# Patient Record
Sex: Male | Born: 1935 | Race: White | Hispanic: No | Marital: Married | Smoking: Never smoker
Health system: Southern US, Community
[De-identification: ages and names within clinical notes are randomized; demographics above are authoritative.]

## PROBLEM LIST (undated history)

## (undated) DIAGNOSIS — G309 Alzheimer's disease, unspecified: Secondary | ICD-10-CM

## (undated) DIAGNOSIS — E039 Hypothyroidism, unspecified: Secondary | ICD-10-CM

## (undated) DIAGNOSIS — F02818 Dementia in other diseases classified elsewhere, unspecified severity, with other behavioral disturbance: Secondary | ICD-10-CM

## (undated) DIAGNOSIS — F0281 Dementia in other diseases classified elsewhere with behavioral disturbance: Secondary | ICD-10-CM

## (undated) DIAGNOSIS — M1990 Unspecified osteoarthritis, unspecified site: Secondary | ICD-10-CM

## (undated) DIAGNOSIS — I10 Essential (primary) hypertension: Secondary | ICD-10-CM

## (undated) HISTORY — PX: RECONSTRUCTION MEDIAL COLLATERAL LIGAMENT ELBOW W/ TENDON GRAFT: SUR1084

## (undated) HISTORY — DX: Essential (primary) hypertension: I10

## (undated) HISTORY — DX: Dementia in other diseases classified elsewhere with behavioral disturbance: F02.81

## (undated) HISTORY — DX: Hypothyroidism, unspecified: E03.9

## (undated) HISTORY — DX: Alzheimer's disease, unspecified: G30.9

## (undated) HISTORY — DX: Dementia in other diseases classified elsewhere, unspecified severity, with other behavioral disturbance: F02.818

## (undated) HISTORY — DX: Unspecified osteoarthritis, unspecified site: M19.90

---

## 2003-03-24 ENCOUNTER — Ambulatory Visit (HOSPITAL_COMMUNITY): Admission: RE | Admit: 2003-03-24 | Discharge: 2003-03-24 | Payer: Self-pay | Admitting: Cardiovascular Disease

## 2003-03-24 ENCOUNTER — Encounter: Payer: Self-pay | Admitting: Cardiovascular Disease

## 2003-06-16 ENCOUNTER — Emergency Department (HOSPITAL_COMMUNITY): Admission: AD | Admit: 2003-06-16 | Discharge: 2003-06-16 | Payer: Self-pay | Admitting: Family Medicine

## 2003-11-03 ENCOUNTER — Ambulatory Visit (HOSPITAL_COMMUNITY): Admission: RE | Admit: 2003-11-03 | Discharge: 2003-11-03 | Payer: Self-pay | Admitting: Cardiovascular Disease

## 2007-09-20 ENCOUNTER — Ambulatory Visit: Payer: Self-pay | Admitting: Vascular Surgery

## 2009-10-01 ENCOUNTER — Ambulatory Visit: Payer: Self-pay | Admitting: Family Medicine

## 2009-10-01 DIAGNOSIS — Z9189 Other specified personal risk factors, not elsewhere classified: Secondary | ICD-10-CM | POA: Insufficient documentation

## 2009-10-01 DIAGNOSIS — E039 Hypothyroidism, unspecified: Secondary | ICD-10-CM | POA: Insufficient documentation

## 2009-10-01 DIAGNOSIS — F068 Other specified mental disorders due to known physiological condition: Secondary | ICD-10-CM

## 2009-10-01 DIAGNOSIS — I10 Essential (primary) hypertension: Secondary | ICD-10-CM | POA: Insufficient documentation

## 2009-10-01 DIAGNOSIS — M129 Arthropathy, unspecified: Secondary | ICD-10-CM | POA: Insufficient documentation

## 2009-10-02 LAB — CONVERTED CEMR LAB
BUN: 13 mg/dL (ref 6–23)
Basophils Absolute: 0 10*3/uL (ref 0.0–0.1)
Basophils Relative: 0.3 % (ref 0.0–3.0)
CO2: 28 meq/L (ref 19–32)
Calcium: 8.9 mg/dL (ref 8.4–10.5)
Chloride: 110 meq/L (ref 96–112)
Creatinine, Ser: 1.1 mg/dL (ref 0.4–1.5)
Eosinophils Absolute: 0.2 10*3/uL (ref 0.0–0.7)
Eosinophils Relative: 2.7 % (ref 0.0–5.0)
GFR calc non Af Amer: 67.74 mL/min (ref >60)
Glucose, Bld: 92 mg/dL (ref 70–99)
HCT: 43.6 % (ref 39.0–52.0)
Hemoglobin: 15.1 g/dL (ref 13.0–17.0)
Lymphocytes Relative: 12.7 % (ref 12.0–46.0)
Lymphs Abs: 0.7 10*3/uL (ref 0.7–4.0)
MCHC: 34.7 g/dL (ref 30.0–36.0)
MCV: 100.7 fL — ABNORMAL HIGH (ref 78.0–100.0)
Monocytes Absolute: 0.4 10*3/uL (ref 0.1–1.0)
Monocytes Relative: 7.5 % (ref 3.0–12.0)
Neutro Abs: 4.4 10*3/uL (ref 1.4–7.7)
Neutrophils Relative %: 76.8 % (ref 43.0–77.0)
Platelets: 160 10*3/uL (ref 150.0–400.0)
Potassium: 4.5 meq/L (ref 3.5–5.1)
RBC: 4.33 M/uL (ref 4.22–5.81)
RDW: 13.4 % (ref 11.5–14.6)
Sodium: 148 meq/L — ABNORMAL HIGH (ref 135–145)
TSH: 3.4 microintl units/mL (ref 0.35–5.50)
WBC: 5.7 10*3/uL (ref 4.5–10.5)

## 2010-04-18 ENCOUNTER — Ambulatory Visit: Payer: Self-pay | Admitting: Family Medicine

## 2010-04-18 LAB — CONVERTED CEMR LAB
BUN: 17 mg/dL (ref 6–23)
Basophils Absolute: 0 10*3/uL (ref 0.0–0.1)
Basophils Relative: 0.4 % (ref 0.0–3.0)
CO2: 27 meq/L (ref 19–32)
Calcium: 8.9 mg/dL (ref 8.4–10.5)
Chloride: 101 meq/L (ref 96–112)
Creatinine, Ser: 1.2 mg/dL (ref 0.4–1.5)
Eosinophils Absolute: 0 10*3/uL (ref 0.0–0.7)
Eosinophils Relative: 0.6 % (ref 0.0–5.0)
GFR calc non Af Amer: 63.01 mL/min (ref >60)
Glucose, Bld: 79 mg/dL (ref 70–99)
HCT: 47.1 % (ref 39.0–52.0)
Hemoglobin: 16.2 g/dL (ref 13.0–17.0)
Lymphocytes Relative: 11 % — ABNORMAL LOW (ref 12.0–46.0)
Lymphs Abs: 0.8 10*3/uL (ref 0.7–4.0)
MCHC: 34.3 g/dL (ref 30.0–36.0)
MCV: 101 fL — ABNORMAL HIGH (ref 78.0–100.0)
Monocytes Absolute: 0.6 10*3/uL (ref 0.1–1.0)
Monocytes Relative: 8.4 % (ref 3.0–12.0)
Neutro Abs: 5.6 10*3/uL (ref 1.4–7.7)
Neutrophils Relative %: 79.6 % — ABNORMAL HIGH (ref 43.0–77.0)
Platelets: 157 10*3/uL (ref 150.0–400.0)
Potassium: 4.1 meq/L (ref 3.5–5.1)
RBC: 4.66 M/uL (ref 4.22–5.81)
RDW: 13.4 % (ref 11.5–14.6)
Sodium: 136 meq/L (ref 135–145)
TSH: 1.3 microintl units/mL (ref 0.35–5.50)
WBC: 7 10*3/uL (ref 4.5–10.5)

## 2010-05-21 ENCOUNTER — Encounter: Payer: Self-pay | Admitting: Family Medicine

## 2010-06-25 ENCOUNTER — Telehealth (INDEPENDENT_AMBULATORY_CARE_PROVIDER_SITE_OTHER): Payer: Self-pay | Admitting: *Deleted

## 2010-07-23 NOTE — Assessment & Plan Note (Signed)
Summary: NEW PATIENT /[facility]   Vital Signs:  Patient profile:   77 year old male Height:      67.5 inches Weight:      163.8 pounds Temp:     97.5 degrees F oral Pulse rate:   72 / minute Pulse rhythm:   regular BP sitting:   112 / 80  (left arm) Cuff size:   regular  Vitals Entered By: Benny Lennert CMA Duncan Dull) (October 01, 2009 9:04 AM)  History of Present Illness: Chief complaint new patient to be established   HTN: stable on meds  Thyroid: stable on current meds  Memory - has been an issue for about eight years. Getting worse. Namenda one by mouth two times a day  Exelon patch Was also on Aricept at the onset - went sort of crazy  Fractures as a child  ? Diminished blood supply to feel Feet are cold now about all the time  No office viist, > 6 mo  Left knee: small effusion, some pain and limping. No traumatic injury. No symptomatic giving way. No mechanical locking up.     Preventive Screening-Counseling & Management  Alcohol-Tobacco     Smoking Status: never  Caffeine-Diet-Exercise     Does Patient Exercise: no      Drug Use:  no.    Allergies (verified): No Known Drug Allergies  Past History:  Past medical, surgical, family and social histories (including risk factors) reviewed, and no changes noted (except as noted below).  Past Medical History: ESSENTIAL HYPERTENSION (ICD-401.9) CHICKENPOX, HX OF (ICD-V15.9) ARTHRITIS (ICD-716.90) Hypothyroidism Dementia  Past Surgical History: elbow reconstruction from fall 1977  Family History: Reviewed history and no changes required. Family History of Alcoholism/Addiction Family History Breast cancer 1st degree relative <50 Family History of Stroke F 1st degree relative <60 Family History of Stroke M 1st degree relative <50 Family History of Sudden Death  Social History: Reviewed history and no changes required. Retired Married Never Smoked Alcohol use-no Drug use-no Regular exercise-no Smoking  Status:  never Drug Use:  no Does Patient Exercise:  no  Review of Systems       ROS: GEN: No acute illnesses, no fevers, chills, sweats, fatigue, weight loss, or URI sx. GI: No n/v/d Pulm: No SOB, cough, wheezing Interactive and getting along well at home, but with memory issues. Otherwise, the pertinent positives and negatives are listed above and in the HPI, otherwise a full review of systems has been reviewed and is negative unless noted positive.   Physical Exam  General:  Well-developed,well-nourished,in no acute distress; alert,appropriate and cooperative throughout examination Head:  Normocephalic and atraumatic without obvious abnormalities. No apparent alopecia or balding. Ears:  no external deformities.   Nose:  no external deformity.   Mouth:  pharynx pink and moist.   Neck:  No deformities, masses, or tenderness noted. Lungs:  Normal respiratory effort, chest expands symmetrically. Lungs are clear to auscultation, no crackles or wheezes. Heart:  Normal rate and regular rhythm. S1 and S2 normal without gallop, murmur, click, rub or other extra sounds. Abdomen:  Bowel sounds positive,abdomen soft and non-tender without masses, organomegaly or hernias noted. Msk:  B knees Gait: Normal heel toe pattern ROM: WNL Effusion: mild L Echymosis or edema: none Patellar tendon NT Painful PLICA: neg Patellar grind: negative Medial and lateral patellar facet loading: negative medial and lateral joint lines: mod tender medial joint line Mcmurray's neg Flexion-pinch neg Varus and valgus stress: stable Lachman: neg Ant and Post drawer: neg  Hip abduction, IR, ER: WNL Hip flexion str: 5/5 Hip abd: 5/5 Quad: 5/5 VMO atrophy: mild Hamstring concentric and eccentric: 5/5  Neurologic:  gait normal.   Psych:  normally interactive, good eye contact, not anxious appearing, and not depressed appearing.     Impression & Recommendations:  Problem # 1:  ARTHRITIS  (ICD-716.90) Assessment New Please see the patient instructions for a detailed list of plans and what was discussed with the patient.   L knee OA flare Start with conservative management first  Orders: Prescription Created Electronically [phone])  Problem # 2:  DEMENTIA (ICD-294.8) Assessment: New worsening, cont with Namenda, at max dosing  failed exelon and aricept  Problem # 3:  HYPOTHYROIDISM (ICD-244.9) Assessment: New  His updated medication list for this problem includes:    Synthroid 88 Mcg Tabs (Levothyroxine sodium) ..... Once daily  Orders: TLB-TSH (Thyroid Stimulating Hormone) (84443-TSH)  Problem # 4:  ESSENTIAL HYPERTENSION (ICD-401.9) Assessment: New  His updated medication list for this problem includes:    Cozaar 100 Mg Tabs (Losartan potassium) ..... Once daily  Orders: TLB-BMP (Basic Metabolic Panel-BMET) (80048-METABOL)  BP today: 112/80  Problem # 5:  ENCOUNTER FOR LONG-TERM USE OF OTHER MEDICATIONS (ICD-V58.69)  Orders: Venipuncture (60454) TLB-CBC Platelet - w/Differential (85025-CBCD)  Complete Medication List: 1)  Flomax 0.4 Mg Caps (Tamsulosin hcl) .... Once daily 2)  Cozaar 100 Mg Tabs (Losartan potassium) .... Once daily 3)  Synthroid 88 Mcg Tabs (Levothyroxine sodium) .... Once daily 4)  Namenda 10 Mg Tabs (Memantine hcl) .... 2 daily 5)  Aspir-low 81 Mg Tbec (Aspirin) .... Once daily 6)  Vitamin D  7)  Vitamin B-12 1000 Mcg Tabs (Cyanocobalamin) .... Daily 8)  Meloxicam 7.5 Mg Tabs (Meloxicam) .Marland Kitchen.. 1 by mouth daily  Patient Instructions: 1)  Tylenol: 2 tablets up to 3-4 times a day 2)  Regular NSAIDS (Meloxicam) - as needed 3)  Topical Capzaicin Cream, as needed (wear glove to put on) 4)  Topical Voltaren (NSAID) Gel can help  5)  For flares, corticosteroid injections help. 6)  Hyaluronic Acid injections have good success, average relief is 6 months 7)  Glucosamine and Chondroitin often helpful 8)  Omega-3 fish oils may help 9)   Ice joints on bad days, 20 min, 2-3 x / day 10)  REGULAR EXERCISE: swimming, Yoga, Tai Chi, bicycle (NON-IMPACT activity  11)  f/u 6 months Prescriptions: MELOXICAM 7.5 MG TABS (MELOXICAM) 1 by mouth daily  #30 x 5   Entered and Authorized by:   Hannah Beat MD   Signed by:   Hannah Beat MD on 10/01/2009   Method used:   Electronically to        [facility]* (retail)       [address]       [place], [place]  [address]       Ph: [phone]       Fax: [phone]   RxID:   [id number]   Current Allergies (reviewed today): No known allergies

## 2010-07-23 NOTE — Letter (Signed)
Summary: [facility]  [facility]   Imported By: Lester [place] 05/25/2010 10:46:47  _____________________________________________________________________  External Attachment:    Type:   Image     Comment:   External Document

## 2010-07-23 NOTE — Assessment & Plan Note (Signed)
Summary: 6 MTH FU/CLE   Vital Signs:  Patient profile:   77 year old male Height:      67.5 inches Weight:      158 pounds BMI:     24.47 Temp:     97.4 degrees F oral Pulse rate:   76 / minute Pulse rhythm:   regular BP sitting:   122 / 76  (left arm) Cuff size:   regular  Vitals Entered By: Lewanda Rife LPN (April 18, 2010 11:52 AM) CC: six month f/u   History of Present Illness: 77 year old male:  Dementia: Short term memory is very poor. Gets lost some with readigng. Has gotten lost several times in the car, once ended up in [place].   Son just moved up to help out.  Sleeping well.  Family just made the decision he needs not to drive - he is OK with that.  HTN: stable and doing well  Asymptomatic from a thyroid standpoint, some ? dep per wife.   Allergies (verified): No Known Drug Allergies  Past History:  Past medical, surgical, family and social histories (including risk factors) reviewed, and no changes noted (except as noted below).  Past Medical History: Reviewed history from 10/01/2009 and no changes required. ESSENTIAL HYPERTENSION (ICD-401.9) CHICKENPOX, HX OF (ICD-V15.9) ARTHRITIS (ICD-716.90) Hypothyroidism Dementia  Past Surgical History: Reviewed history from 10/01/2009 and no changes required. elbow reconstruction from fall 1977  Family History: Reviewed history from 10/01/2009 and no changes required. Family History of Alcoholism/Addiction Family History Breast cancer 1st degree relative <50 Family History of Stroke F 1st degree relative <60 Family History of Stroke M 1st degree relative <50 Family History of Sudden Death  Social History: Reviewed history from 10/01/2009 and no changes required. Retired Married Never Smoked Alcohol use-no Drug use-no Regular exercise-no  Review of Systems General:  Denies chills and fever. CV:  Denies chest pain or discomfort. Neuro:  Complains of difficulty with concentration, headaches, and  memory loss; denies brief paralysis, disturbances in coordination, inability to speak, numbness, poor balance, and visual disturbances; 1 ha for a few hours a few weeks ago.  Physical Exam  Additional Exam:  GEN: WDWN, NAD, Non-toxic, A & O x 3 HEENT: Atraumatic, Normocephalic. Neck supple. No masses, No LAD. Ears and Nose: No external deformity. CV: RRR, No M/G/R. No JVD. No thrill. No extra heart sounds. PULM: CTA B, no wheezes, crackles, rhonchi. No retractions. No resp. distress. No accessory muscle use. EXTR: No c/c/e NEURO: Normal gait.  PSYCH: Normally interactive. Conversant. Not depressed or anxious appearing.  Calm demeanor.     Impression & Recommendations:  Problem # 1:  DEMENTIA (ICD-294.8) progressing without any abrupt changes. poor short term memory. could not tolerate aricept ? if namenda helping  good family support son just moved in to help  Problem # 2:  HYPOTHYROIDISM (ICD-244.9)  His updated medication list for this problem includes:    Synthroid 88 Mcg Tabs (Levothyroxine sodium) ..... Once daily  Orders: TLB-TSH (Thyroid Stimulating Hormone) (84443-TSH)  Problem # 3:  ESSENTIAL HYPERTENSION (ICD-401.9)  His updated medication list for this problem includes:    Cozaar 100 Mg Tabs (Losartan potassium) ..... Once daily  Complete Medication List: 1)  Flomax 0.4 Mg Caps (Tamsulosin hcl) .... Once daily 2)  Cozaar 100 Mg Tabs (Losartan potassium) .... Once daily 3)  Synthroid 88 Mcg Tabs (Levothyroxine sodium) .... Once daily 4)  Namenda 10 Mg Tabs (Memantine hcl) .... 2 daily 5)  Aspir-low 81  Mg Tbec (Aspirin) .... Once daily 6)  Vitamin B-12 1000 Mcg Tabs (Cyanocobalamin) .... Daily 7)  Meloxicam 7.5 Mg Tabs (Meloxicam) .Marland Kitchen.. 1 by mouth daily as needed 8)  Vitamin D 400 Unit Tabs (Cholecalciferol) .... Take 1 tablet by mouth once a day  Other Orders: Flu Vaccine 60yrs + MEDICARE PATIENTS (Z6109) Administration Flu vaccine - MCR (G0008) Venipuncture  (60454) TLB-BMP (Basic Metabolic Panel-BMET) (80048-METABOL) TLB-CBC Platelet - w/Differential (85025-CBCD)  Patient Instructions: 1)  f/u 6 months   Orders Added: 1)  Flu Vaccine 38yrs + MEDICARE PATIENTS [Q2039] 2)  Administration Flu vaccine - MCR [G0008] 3)  Venipuncture [36415] 4)  TLB-BMP (Basic Metabolic Panel-BMET) [80048-METABOL] 5)  TLB-CBC Platelet - w/Differential [85025-CBCD] 6)  TLB-TSH (Thyroid Stimulating Hormone) [84443-TSH] 7)  Est. Patient Level IV [09811]    Current Allergies (reviewed today): No known allergies   Flu Vaccine Consent Questions     Do you have a history of severe allergic reactions to this vaccine? no    Any prior history of allergic reactions to egg and/or gelatin? no    Do you have a sensitivity to the preservative Thimersol? no    Do you have a past history of Guillan-Barre Syndrome? no    Do you currently have an acute febrile illness? no    Have you ever had a severe reaction to latex? no    Vaccine information given and explained to patient? yes    Are you currently pregnant? no    Lot Number:[id number]   Exp Date:12/21/2010   Site Given  Left Deltoid IMmedflu1 Lewanda Rife LPN  April 18, 2010 11:54 AM

## 2010-07-25 NOTE — Progress Notes (Signed)
Summary: wants to change to aricept  Phone Note Call from Patient Call back at Home Phone [phone]   Caller: Spouse Call For: Hannah Beat MD Summary of Call: Pt's wife states nemenda is expensive and she is asking if pt can try generic aricept.  Uses [facility].  She knows you are out of the office today.  Initial call taken by: Lowella Petties CMA, AAMA,  June 25, 2010 11:09 AM  Follow-up for Phone Call        can they follow-up with me a few weeks after starting.  often causes some sleep disturbance, vivid dreams - particularly at the beginning, and nausea. take after dinner Follow-up by: Hannah Beat MD,  June 25, 2010 11:50 AM  Additional Follow-up for Phone Call Additional follow up Details #1::        patietn wife advised and she says he has a follow up appt pretty soon.Consuello Masse CMA   Additional Follow-up by: Benny Lennert CMA (AAMA),  June 25, 2010 12:01 PM    New/Updated Medications: DONEPEZIL HCL 5 MG TABS (DONEPEZIL HCL) 1 by mouth at bedtime after dinner Prescriptions: DONEPEZIL HCL 5 MG TABS (DONEPEZIL HCL) 1 by mouth at bedtime after dinner  #30 x 3   Entered and Authorized by:   Hannah Beat MD   Signed by:   Hannah Beat MD on 06/25/2010   Method used:   Electronically to        [facility]* (retail)       [address]       [place], [place]  [address]       Ph: [phone]       Fax: [phone]   RxID:   [id number]

## 2010-08-21 ENCOUNTER — Encounter: Payer: Self-pay | Admitting: Family Medicine

## 2010-10-10 ENCOUNTER — Ambulatory Visit (INDEPENDENT_AMBULATORY_CARE_PROVIDER_SITE_OTHER): Payer: Medicare Other | Admitting: Family Medicine

## 2010-10-10 ENCOUNTER — Encounter: Payer: Self-pay | Admitting: Family Medicine

## 2010-10-10 DIAGNOSIS — F068 Other specified mental disorders due to known physiological condition: Secondary | ICD-10-CM

## 2010-10-10 DIAGNOSIS — E039 Hypothyroidism, unspecified: Secondary | ICD-10-CM

## 2010-10-10 DIAGNOSIS — I10 Essential (primary) hypertension: Secondary | ICD-10-CM

## 2010-10-10 MED ORDER — DONEPEZIL HCL 10 MG PO TABS: 10.0000 mg | ORAL_TABLET | Freq: Every day | ORAL | Status: DC

## 2010-10-10 NOTE — Patient Instructions (Signed)
F/u 6 months

## 2010-10-10 NOTE — Progress Notes (Signed)
77 year old:  F/u Thyroid:  Dementia:  HTN:  [facility] at [facility] Attn: Dr. Zollie Pee   Subjective:     Dylan Roach is a 77 y.o. male who presents for follow up of hypothyroidism, dementia, HTN. Current symptoms: none. Patient denies heat / cold intolerance and nervousness. Symptoms have been well-controlled.  Dementia, worsening relatively dramatically. Outbursts of aggression towards wife, violent behavior per report. Now sleeping in different bedrooms with lock on bedroom. Son moved into their house which has helped with stability and safety. Will often forget family member's names.   Daughter lives across the street.  Namenda - d/c secondary to cost, and has tolerated Aricept 5 mg, which previously could not tolerate.  HTN: Tolerating all medications without side effects Stable and at goal No CP, no sob. No HA.  BP Readings from Last 3 Encounters:  10/10/10 130/80  04/18/10 122/76  10/01/09 112/80    Patient Active Problem List  Diagnoses  . HYPOTHYROIDISM  . DEMENTIA  . ESSENTIAL HYPERTENSION  . ARTHRITIS  . CHICKENPOX, HX OF   Past Medical History  Diagnosis Date  . Arthritis   . Thyroid disease   . Dementia   . History of chicken pox   . Hypertension    Past Surgical History  Procedure Date  . Reconstruction medial collateral ligament elbow w/ tendon graft    History  Substance Use Topics  . Smoking status: Never Smoker   . Smokeless tobacco: Not on file  . Alcohol Use: No   No family history on file. No Known Allergies Current Outpatient Prescriptions on File Prior to Visit  Medication Sig Dispense Refill  . aspirin 81 MG EC tablet Take 81 mg by mouth daily.        Marland Kitchen levothyroxine (SYNTHROID, LEVOTHROID) 88 MCG tablet Take 88 mcg by mouth daily.        Marland Kitchen losartan (COZAAR) 100 MG tablet Take 100 mg by mouth daily.        . meloxicam (MOBIC) 7.5 MG tablet Take 7.5 mg by mouth daily.        . Tamsulosin HCl (FLOMAX)  0.4 MG CAPS Take 0.4 mg by mouth daily.        . vitamin B-12 (CYANOCOBALAMIN) 1000 MCG tablet Take 1,000 mcg by mouth daily.        . vitamin D, CHOLECALCIFEROL, 400 UNITS tablet Take 400 Units by mouth daily.        Marland Kitchen DISCONTD: donepezil (ARICEPT) 5 MG tablet Take 5 mg by mouth at bedtime.         (Increased to Aricept 10 mg)   Review of Systems ROS: GEN: No acute illnesses, no fevers, chills. GI: No n/v/d, eating normally Pulm: No SOB Memory loss and combativeness is getting more severe Otherwise, ROS is as per the HPI.    Objective:   GEN: WDWN, NAD, Non-toxic HEENT: Atraumatic, Normocephalic. Neck supple. No masses, No LAD. Ears and Nose: No external deformity. CV: RRR, No M/G/R. No JVD. No thrill. No extra heart sounds. PULM: CTA B, no wheezes, crackles, rhonchi. No retractions. No resp. distress. No accessory muscle use. ABD: S, NT, ND, +BS. No rebound tenderness. No HSM.  EXTR: No c/c/e NEURO Normal gait.  PSYCH: Normally interactive. Conversant. Not depressed or anxious appearing.  Calm demeanor.   Laboratory: Lab Results  Component Value Date   TSH 1.30 04/18/2010      Assessment:    Hypothyroidism.  Replacement per meds.   Dementia, unstable, worsening HTN, stable, doing well   Plan:    1. L-thyroxine per orders. 2. Recheck thyroid function tests today 3. Repeat other baseline labs as below 4. Worsening dementia, more combative, aggressive. They are going to consult [facility], which seems very reasonable. Welcome consult, advice or suggestions regarding long-term management.

## 2010-10-11 LAB — BASIC METABOLIC PANEL
CO2: 31 mEq/L (ref 19–32)
Chloride: 105 mEq/L (ref 96–112)
Glucose, Bld: 75 mg/dL (ref 70–99)
Potassium: 4.2 mEq/L (ref 3.5–5.1)
Sodium: 145 mEq/L (ref 135–145)

## 2010-10-11 LAB — VITAMIN D 25 HYDROXY (VIT D DEFICIENCY, FRACTURES): Vit D, 25-Hydroxy: 43 ng/mL (ref 30–89)

## 2010-10-11 LAB — TSH: TSH: 1.56 u[IU]/mL (ref 0.35–5.50)

## 2010-11-06 ENCOUNTER — Telehealth: Payer: Self-pay | Admitting: *Deleted

## 2010-11-06 NOTE — Telephone Encounter (Signed)
Wife called asking if records had been faxed to [facility] because his appt is tomorrow.   Per Herbert Seta they were faxed, wife notified.

## 2010-11-08 NOTE — Cardiovascular Report (Signed)
NAME:  Dylan Roach, Dylan Roach                        ACCOUNT NO.:  [id number]   MEDICAL RECORD NO.:  [id number]                   PATIENT TYPE:  OIB   LOCATION:  2899                                 FACILITY:  [facility]   PHYSICIAN:  Ricki Rodriguez, M.D.               DATE OF BIRTH:  [birth year]   DATE OF PROCEDURE:  11/03/2003  DATE OF DISCHARGE:  11/03/2003                              CARDIAC CATHETERIZATION   PROCEDURES PERFORMED:  1. Left heart catheterization.  2. Selective coronary angiography.  3. Left ventricular systolic function study.   CARDIOLOGIST:  Ricki Rodriguez, M.D.   INDICATIONS:  This 77 year old white male had an abnormal stress test along  with atypical chest pain and shortness of breath, plus history of  hypertension.   ACCESS:  Approach; right femoral artery using 5 French sheath.   HEMODYNAMIC DATA:  The left ventricular pressure was 123/16 and aortic  pressure was 123/59.   ANGIOGRAPHIC DATA:  Coronary Anatomy  Left Main Coronary Artery:  The left main coronary artery showed proximal  calcification, otherwise was unremarkable.   Left Anterior Descending Coronary Artery.  The left anterior descending  coronary artery also showed proximal calcification and luminal  irregularities proximally, and a distal diffuse narrowing.  The diagonal-1  was unremarkable.   Left Circumflex Coronary Artery:  The left circumflex coronary artery was a  relatively smaller vessel and its ramus branch was a larger vessel with  proximal luminal irregularities.  The obtuse marginal branches were  rudimentary.   Right Coronary Artery:  The right coronary artery is dominant with a  proximal 20% stenosis followed by 40% stenosis, then midvessel luminal  irregularities; and, also showed mild calcification in the proximal half of  the vessel.  Its posterior descending coronary artery and posterolateral  branches were unremarkable.   VENTRICULOGRAPHIC DATA:  Left Ventriculogram:  The  left ventriculogram  showed mild anterior and inferior hypokinesia with an ejection fraction of  50%.   IMPRESSION:  1. Mild-to-moderate multivessel native vessels coronary artery disease.  2. Mild left ventricular systolic dysfunction.   RECOMMENDATIONS:  This patient will be treated medically by Dr. Orpah Cobb.                                               Ricki Rodriguez, M.D.    ASK/MEDQ  D:  11/03/2003  T:  11/03/2003  Job:  [id number]

## 2010-11-27 ENCOUNTER — Encounter: Payer: Self-pay | Admitting: Family Medicine

## 2010-11-27 ENCOUNTER — Encounter: Payer: Self-pay | Admitting: *Deleted

## 2010-11-28 ENCOUNTER — Encounter: Payer: Self-pay | Admitting: Family Medicine

## 2010-11-28 ENCOUNTER — Ambulatory Visit (INDEPENDENT_AMBULATORY_CARE_PROVIDER_SITE_OTHER): Payer: Medicare Other | Admitting: Family Medicine

## 2010-11-28 DIAGNOSIS — R5381 Other malaise: Secondary | ICD-10-CM

## 2010-11-28 DIAGNOSIS — F0280 Dementia in other diseases classified elsewhere without behavioral disturbance: Secondary | ICD-10-CM

## 2010-11-28 DIAGNOSIS — R110 Nausea: Secondary | ICD-10-CM

## 2010-11-28 DIAGNOSIS — F068 Other specified mental disorders due to known physiological condition: Secondary | ICD-10-CM

## 2010-11-28 DIAGNOSIS — R5383 Other fatigue: Secondary | ICD-10-CM

## 2010-11-28 DIAGNOSIS — R413 Other amnesia: Secondary | ICD-10-CM

## 2010-11-28 DIAGNOSIS — G309 Alzheimer's disease, unspecified: Secondary | ICD-10-CM

## 2010-11-28 MED ORDER — DONEPEZIL HCL 5 MG PO TABS: 5.0000 mg | ORAL_TABLET | Freq: Every evening | ORAL | Status: DC | PRN

## 2010-11-28 NOTE — Patient Instructions (Addendum)
Decrease the ARICEPT TO 5 MG CUT THE CELEXA IN HALF TO 10 MG  PUD and Dyspepsia  STOP THE MELOXICAM RIGHT NOW -   2. Stop aspirin and NSAID use 3. If mild, OTC Zantac or Pepcid can help, treat 6-12 weeks 4. If severe, prescription likely needed, 3-6 months 5. Smoking, alcohol, caffeine can make worse 6. Don't eat foods that make you worse   REFERRAL: GO THE THE FRONT ROOM AT THE ENTRANCE OF OUR CLINIC, NEAR CHECK IN. ASK FOR MARION. SHE WILL HELP YOU SET UP YOUR REFERRAL. DATE: TIME:

## 2010-11-28 NOTE — Progress Notes (Signed)
Dylan Roach, a 77 y.o. male presents today in the office for the following:    Stomach:  Worsening dementia over the last few years. Recent evaluation at the [facility] memory clinic. Recommendations included Celexa and MRI for evaluation for potential component of Vascular dementia.   Lab Results  Component Value Date   TSH 1.56 10/10/2010   Lab Results  Component Value Date   VITAMINB12 733 10/10/2010   Printed a copy of labs for the patient's wife. Feels tired. Diarrhea. Weak, loss of apetite.  Nauseous, not eating well, some diarrhea.  All started a couple of months ago.  Fatigue:  A few weeks ago. Started to stay in bed a lot of the time.  Sleeping all day. Has gotten better in the last few days.   Pain in the lateral side, now gone. There is some burping more frequently now.  ROS above  Patient Active Problem List  Diagnoses  . HYPOTHYROIDISM  . ESSENTIAL HYPERTENSION  . ARTHRITIS  . CHICKENPOX, HX OF  . Memory loss  . Alzheimer's dementia   Past Medical History  Diagnosis Date  . Arthritis   . Alzheimer's dementia with behavioral disturbance   . Hypertension   . Hypothyroid    Past Surgical History  Procedure Date  . Reconstruction medial collateral ligament elbow w/ tendon graft    History  Substance Use Topics  . Smoking status: Never Smoker   . Smokeless tobacco: Not on file  . Alcohol Use: No   No family history on file. No Known Allergies Current Outpatient Prescriptions on File Prior to Visit  Medication Sig Dispense Refill  . aspirin 81 MG EC tablet Take 81 mg by mouth daily.        Marland Kitchen levothyroxine (SYNTHROID, LEVOTHROID) 88 MCG tablet Take 88 mcg by mouth daily.        Marland Kitchen losartan (COZAAR) 100 MG tablet Take 100 mg by mouth daily.        . Tamsulosin HCl (FLOMAX) 0.4 MG CAPS Take 0.4 mg by mouth daily.        . vitamin B-12 (CYANOCOBALAMIN) 1000 MCG tablet Take 1,000 mcg by mouth daily.        . vitamin D, CHOLECALCIFEROL, 400 UNITS tablet Take  400 Units by mouth daily.        . meloxicam (MOBIC) 7.5 MG tablet Take 7.5 mg by mouth daily.          Physical Exam  Blood pressure 94/60, pulse 76, temperature 98 F (36.7 C), temperature source Oral, height 5' 7.5" (1.715 m), weight 144 lb 4 oz (65.431 kg).  GEN: WDWN, NAD, Non-toxic, A & O x 3 HEENT: Atraumatic, Normocephalic. Neck supple. No masses, No LAD. Ears and Nose: No external deformity. CV: RRR, No M/G/R. No JVD. No thrill. No extra heart sounds. PULM: CTA B, no wheezes, crackles, rhonchi. No retractions. No resp. distress. No accessory muscle use. ABD: S, NT, ND, +BS. No rebound tenderness. No HSM.  EXTR: No c/c/e NEURO Normal gait.  PSYCH: Normally interactive. Conversant. Not depressed or anxious appearing.  Calm demeanor.    A/P:  1. Alzheimer's: Copy of labs reviewed with patient and wife - given copy for [facility]. Obtain an MRI of the brain without contrast to evaluate for potential acute vs. Subacute stroke in the setting of memory loss and behavioral change.  2. Nausea: Timing suggests goes along with Aricept alteration of dose. Classic SE of nausea and diarrhea. Will decrease to 5  mg. If not improving, would d/c trial.  3. GERD: some reflux now  4. Fatigue: increased, may be from higher dose celexa in the elderly. Will change to 10 mg. This was done primarily to decrease sexual impulsiveness and aggression towards wife, and has had a great effect, so if this returns, then we will restart.

## 2010-12-02 ENCOUNTER — Encounter: Payer: Self-pay | Admitting: Family Medicine

## 2010-12-02 DIAGNOSIS — F0280 Dementia in other diseases classified elsewhere without behavioral disturbance: Secondary | ICD-10-CM | POA: Insufficient documentation

## 2010-12-02 DIAGNOSIS — G309 Alzheimer's disease, unspecified: Secondary | ICD-10-CM | POA: Insufficient documentation

## 2010-12-05 ENCOUNTER — Ambulatory Visit
Admission: RE | Admit: 2010-12-05 | Discharge: 2010-12-05 | Disposition: A | Discharge status: Home / Self Care | Payer: Medicare Other | Source: Ambulatory Visit | Attending: Family Medicine | Admitting: Family Medicine

## 2010-12-05 DIAGNOSIS — R413 Other amnesia: Secondary | ICD-10-CM

## 2010-12-05 DIAGNOSIS — F068 Other specified mental disorders due to known physiological condition: Secondary | ICD-10-CM

## 2010-12-17 ENCOUNTER — Other Ambulatory Visit: Payer: Self-pay | Admitting: *Deleted

## 2010-12-17 MED ORDER — LEVOTHYROXINE SODIUM 88 MCG PO TABS: 88.0000 ug | ORAL_TABLET | Freq: Every day | ORAL | Status: DC

## 2010-12-17 MED ORDER — TAMSULOSIN HCL 0.4 MG PO CAPS: 0.4000 mg | ORAL_CAPSULE | Freq: Every day | ORAL | Status: DC

## 2011-01-01 ENCOUNTER — Ambulatory Visit (INDEPENDENT_AMBULATORY_CARE_PROVIDER_SITE_OTHER): Payer: Medicare Other | Admitting: Family Medicine

## 2011-01-01 ENCOUNTER — Encounter: Payer: Self-pay | Admitting: Family Medicine

## 2011-01-01 DIAGNOSIS — D729 Disorder of white blood cells, unspecified: Secondary | ICD-10-CM

## 2011-01-01 DIAGNOSIS — R634 Abnormal weight loss: Secondary | ICD-10-CM | POA: Insufficient documentation

## 2011-01-01 DIAGNOSIS — D7289 Other specified disorders of white blood cells: Secondary | ICD-10-CM

## 2011-01-01 DIAGNOSIS — R5381 Other malaise: Secondary | ICD-10-CM

## 2011-01-01 DIAGNOSIS — R5383 Other fatigue: Secondary | ICD-10-CM | POA: Insufficient documentation

## 2011-01-01 DIAGNOSIS — R109 Unspecified abdominal pain: Secondary | ICD-10-CM

## 2011-01-01 LAB — CBC WITH DIFFERENTIAL/PLATELET
Basophils Relative: 0.2 % (ref 0.0–3.0)
Eosinophils Relative: 0.1 % (ref 0.0–5.0)
HCT: 41.9 % (ref 39.0–52.0)
Lymphs Abs: 0.7 10*3/uL (ref 0.7–4.0)
MCV: 100.6 fl — ABNORMAL HIGH (ref 78.0–100.0)
Monocytes Absolute: 0.4 10*3/uL (ref 0.1–1.0)
Monocytes Relative: 5.4 % (ref 3.0–12.0)
RBC: 4.16 Mil/uL — ABNORMAL LOW (ref 4.22–5.81)
WBC: 6.4 10*3/uL (ref 4.5–10.5)

## 2011-01-01 NOTE — Progress Notes (Addendum)
Dylan Roach, a 77 y.o. male presents today in the office for the following:   Having some aches and pains and pain some in the back and on the right side, but will go away before the day is over.   Fatigued and energy level is low. Had some troule standing and felt too weak to shave. Laid down for an hour or so, and then he felt ok.   Weight loss, down eight pounds since 10/2010. "Cannot eat" Down 23 pounds since early 2011  No CP, SOB, bloody stool, bloody urine.  C/o flank pain, worse with motion.   No overt abd pain.  Nausea improved with decreased medication dosing.  Patient Active Problem List  Diagnoses  . HYPOTHYROIDISM  . ESSENTIAL HYPERTENSION  . ARTHRITIS  . CHICKENPOX, HX OF  . Memory loss  . Alzheimer's dementia  . Abdominal  pain, other specified site  . Weight loss, non-intentional   Past Medical History  Diagnosis Date  . Arthritis   . Alzheimer's dementia with behavioral disturbance   . Hypertension   . Hypothyroid    Past Surgical History  Procedure Date  . Reconstruction medial collateral ligament elbow w/ tendon graft    History  Substance Use Topics  . Smoking status: Never Smoker   . Smokeless tobacco: Not on file  . Alcohol Use: No   No family history on file. No Known Allergies Current Outpatient Prescriptions on File Prior to Visit  Medication Sig Dispense Refill  . aspirin 81 MG EC tablet Take 81 mg by mouth daily.        . citalopram (CELEXA) 20 MG tablet Take 20 mg by mouth daily.        Marland Kitchen donepezil (ARICEPT) 5 MG tablet Take 1 tablet (5 mg total) by mouth at bedtime as needed.  30 tablet  5  . levothyroxine (SYNTHROID, LEVOTHROID) 88 MCG tablet Take 1 tablet (88 mcg total) by mouth daily.  30 tablet  11  . losartan (COZAAR) 100 MG tablet Take 100 mg by mouth daily.        . Tamsulosin HCl (FLOMAX) 0.4 MG CAPS Take 1 capsule (0.4 mg total) by mouth daily.  30 capsule  11  . vitamin B-12 (CYANOCOBALAMIN) 1000 MCG tablet Take 1,000  mcg by mouth daily.        . vitamin D, CHOLECALCIFEROL, 400 UNITS tablet Take 400 Units by mouth daily.        . meloxicam (MOBIC) 7.5 MG tablet Take 7.5 mg by mouth daily.          Ros above   Physical Exam  Blood pressure 120/76, pulse 70, temperature 97.6 F (36.4 C), temperature source Oral, height 5' 7.5" (1.715 m), weight 140 lb 1.9 oz (63.558 kg), SpO2 98.00%.  GEN: WDWN, NAD, Non-toxic, A & O x 3 HEENT: Atraumatic, Normocephalic. Neck supple. No masses, No LAD. Ears and Nose: No external deformity. CV: RRR, No M/G/R. No JVD. No thrill. No extra heart sounds. PULM: CTA B, no wheezes, crackles, rhonchi. No retractions. No resp. distress. No accessory muscle use. MSK: TTP at R oblique insertion on R ribs.  ABD: s, nt, nd, +BS, no rebound. EXTR: No c/c/e NEURO Normal gait.  PSYCH: Normally interactive. Conversant. Not depressed or anxious appearing.  Calm demeanor.   Assessment and Plan: 1.  abd pain, flank: insertional, provocable with motion. Reassured. Check ESR 2. Weakness / decreased appetitie. Add Ensure or Boost. Check prealbumin. Check CBC.  Addendum: At the time of addendum:  CBC:    Component Value Date/Time   WBC 6.4 01/01/2011 1437   HGB 14.4 01/01/2011 1437   HCT 41.9 01/01/2011 1437   PLT 184.0 01/01/2011 1437   MCV 100.6* 01/01/2011 1437   NEUTROABS 5.4 01/01/2011 1437   LYMPHSABS 0.7 01/01/2011 1437   MONOABS 0.4 01/01/2011 1437   EOSABS 0.0 01/01/2011 1437   BASOSABS 0.0 01/01/2011 1437   Neutrophil % = 84 % and trending up from 8 months ago 40 + pound weight loss  Discussed all in detail with wife, who would like to proceed with further evaluation. Obtain peripheral smear, CMP. CT of the abdomen and pelvis with and without IV contrast to evaluate for potential mass lesion given all of above and recent abdominal and side pain.  Obtain all of this, then proceed with further decision-making processes.

## 2011-01-02 LAB — PREALBUMIN: Prealbumin: 17 mg/dL (ref 17.0–34.0)

## 2011-01-08 ENCOUNTER — Telehealth: Payer: Self-pay | Admitting: *Deleted

## 2011-01-08 NOTE — Progress Notes (Signed)
Addended by: Hannah Beat on: 01/08/2011 02:07 PM   Modules accepted: Orders

## 2011-01-08 NOTE — Telephone Encounter (Signed)
Noted. Please call wife  Additional labs ordered. Help set up blood draw  Also ordered CT of the abdomen and pelvis - marion will call

## 2011-01-08 NOTE — Telephone Encounter (Signed)
Wife wanted you to know that she has talked with daughter and they are ready to go ahead with further testing and whatever else you recommend.

## 2011-01-08 NOTE — Telephone Encounter (Signed)
Patients spouse advised as instructed via telephone.  Schedule Mr. Dylan Roach for tomorrow morning for labs.

## 2011-01-09 ENCOUNTER — Other Ambulatory Visit (INDEPENDENT_AMBULATORY_CARE_PROVIDER_SITE_OTHER): Payer: Medicare Other | Admitting: Family Medicine

## 2011-01-09 DIAGNOSIS — R5383 Other fatigue: Secondary | ICD-10-CM

## 2011-01-09 DIAGNOSIS — R634 Abnormal weight loss: Secondary | ICD-10-CM

## 2011-01-09 DIAGNOSIS — D7289 Other specified disorders of white blood cells: Secondary | ICD-10-CM

## 2011-01-09 DIAGNOSIS — R109 Unspecified abdominal pain: Secondary | ICD-10-CM

## 2011-01-09 LAB — HEPATIC FUNCTION PANEL
ALT: 14 U/L (ref 0–53)
AST: 18 U/L (ref 0–37)
Bilirubin, Direct: 0.2 mg/dL (ref 0.0–0.3)
Total Protein: 6.4 g/dL (ref 6.0–8.3)

## 2011-01-09 LAB — BASIC METABOLIC PANEL
CO2: 29 mEq/L (ref 19–32)
Chloride: 104 mEq/L (ref 96–112)
Creatinine, Ser: 1.2 mg/dL (ref 0.4–1.5)
Potassium: 4 mEq/L (ref 3.5–5.1)

## 2011-01-13 ENCOUNTER — Other Ambulatory Visit: Payer: Self-pay | Admitting: Family Medicine

## 2011-01-13 ENCOUNTER — Ambulatory Visit (INDEPENDENT_AMBULATORY_CARE_PROVIDER_SITE_OTHER)
Admission: RE | Admit: 2011-01-13 | Discharge: 2011-01-13 | Disposition: A | Discharge status: Home / Self Care | Payer: Medicare Other | Source: Ambulatory Visit | Attending: Family Medicine | Admitting: Family Medicine

## 2011-01-13 DIAGNOSIS — R109 Unspecified abdominal pain: Secondary | ICD-10-CM

## 2011-01-13 DIAGNOSIS — R5383 Other fatigue: Secondary | ICD-10-CM

## 2011-01-13 DIAGNOSIS — R5381 Other malaise: Secondary | ICD-10-CM

## 2011-01-13 DIAGNOSIS — D729 Disorder of white blood cells, unspecified: Secondary | ICD-10-CM

## 2011-01-13 DIAGNOSIS — R634 Abnormal weight loss: Secondary | ICD-10-CM

## 2011-01-13 DIAGNOSIS — D7289 Other specified disorders of white blood cells: Secondary | ICD-10-CM

## 2011-01-13 DIAGNOSIS — D72828 Other elevated white blood cell count: Secondary | ICD-10-CM

## 2011-01-13 MED ORDER — IOHEXOL 300 MG/ML  SOLN
100.0000 mL | Freq: Once | INTRAMUSCULAR | Status: AC | PRN
  Administered 2011-01-13: 100 mL via INTRAVENOUS

## 2011-01-21 ENCOUNTER — Telehealth: Payer: Self-pay | Admitting: *Deleted

## 2011-01-21 NOTE — Telephone Encounter (Signed)
Patients wife is asking if you could call her regarding the ct.

## 2011-01-21 NOTE — Telephone Encounter (Signed)
Discussed CT findings on the phone with wife. Overall very reassuring, he has had a enlarged prostate for years per the wife. Encouraged to eat.

## 2011-03-31 ENCOUNTER — Ambulatory Visit: Payer: Federal, State, Local not specified - PPO | Admitting: Family Medicine

## 2011-03-31 ENCOUNTER — Ambulatory Visit (INDEPENDENT_AMBULATORY_CARE_PROVIDER_SITE_OTHER): Payer: Medicare Other | Admitting: Family Medicine

## 2011-03-31 ENCOUNTER — Encounter: Payer: Self-pay | Admitting: Family Medicine

## 2011-03-31 VITALS — BP 120/70 | HR 91 | Temp 97.8°F | Ht 67.5 in | Wt 144.0 lb

## 2011-03-31 DIAGNOSIS — Z23 Encounter for immunization: Secondary | ICD-10-CM

## 2011-03-31 DIAGNOSIS — F0280 Dementia in other diseases classified elsewhere without behavioral disturbance: Secondary | ICD-10-CM

## 2011-03-31 DIAGNOSIS — I10 Essential (primary) hypertension: Secondary | ICD-10-CM

## 2011-03-31 DIAGNOSIS — M129 Arthropathy, unspecified: Secondary | ICD-10-CM

## 2011-03-31 DIAGNOSIS — E039 Hypothyroidism, unspecified: Secondary | ICD-10-CM

## 2011-03-31 NOTE — Progress Notes (Signed)
  Subjective:    Patient ID: Dylan Roach, male    DOB: 12/13/1923, 77 y.o.   MRN: [id number]  HPI  Dylan Roach, a 77 y.o. male presents today in the office for the following:    HTN: Tolerating all medications without side effects Stable and at goal No CP, no sob. No HA.  BP Readings from Last 3 Encounters:  03/31/11 120/70  01/01/11 120/76  11/28/10 94/60    Basic Metabolic Panel:    Component Value Date/Time   NA 143 01/09/2011 0827   K 4.0 01/09/2011 0827   CL 104 01/09/2011 0827   CO2 29 01/09/2011 0827   BUN 15 01/09/2011 0827   CREATININE 1.2 01/09/2011 0827   GLUCOSE 94 01/09/2011 0827   CALCIUM 9.1 01/09/2011 0827   Thyroid: No symptoms. Labs reviewed. Denies cold / heat intolerance, dry skin, hair loss. No goiter.  Lab Results  Component Value Date   TSH 1.56 10/10/2010    Falling - walker and cane.   Alz: progressing. Getting lost some, memory cont to deteriorate. -- history scanned into system from wife.  Coughing -- has een coughing for about a month Sometimes something will come up. Mild cough.   The PMH, PSH, Social History, Family History, Medications, and allergies have been reviewed in [facility], and have been updated if relevant.   Review of Systems ROS: GEN: No acute illnesses, no fevers, chills. GI: No n/v/d, eating normally Pulm: No SOB, above Interactive and getting along well at home.  Otherwise, ROS is as per the HPI.     Objective:   Physical Exam   Physical Exam  Blood pressure 120/70, pulse 91, temperature 97.8 F (36.6 C), temperature source Oral, height 5' 7.5" (1.715 m), weight 144 lb (65.318 kg), SpO2 98.00%.  GEN: WDWN, NAD, Non-toxic, A & O x 3 HEENT: Atraumatic, Normocephalic. Neck supple. No masses, No LAD. Ears and Nose: No external deformity. CV: RRR, No M/G/R. No JVD. No thrill. No extra heart sounds. PULM: CTA B, no wheezes, crackles, rhonchi. No retractions. No resp. distress. No accessory muscle use. EXTR: No  c/c/e NEURO Normal gait.  PSYCH: Normally interactive. Conversant. Not depressed or anxious appearing.  Calm demeanor.        Assessment & Plan:   1. ESSENTIAL HYPERTENSION    2. Flu vaccine need  Flu vaccine greater than or equal to 3yo preservative free IM  3. HYPOTHYROIDISM    4. ARTHRITIS    5. Alzheimer's dementia      Falling: script written for walker and cane: OA and Alzheimers  O/w cont all meds  F/u memory clinic at [facility] upcoming

## 2011-03-31 NOTE — Patient Instructions (Signed)
Full physical in 6 months (30 min CPX)

## 2011-06-19 ENCOUNTER — Telehealth: Payer: Self-pay | Admitting: Internal Medicine

## 2011-06-19 MED ORDER — MIRTAZAPINE 15 MG PO TABS: 15.0000 mg | ORAL_TABLET | Freq: Every day | ORAL | Status: AC

## 2011-06-19 NOTE — Telephone Encounter (Signed)
Sent in some remeron - he can take this 30 minutes before bed and that usually helps

## 2011-06-19 NOTE — Telephone Encounter (Signed)
Patient's wife called and stated he is not sleeping at night an he is keeping her up at night and wants to know if you could Rx him a prescription to help him sleep.  Please advise.

## 2011-06-26 ENCOUNTER — Telehealth: Payer: Self-pay | Admitting: Internal Medicine

## 2011-07-01 NOTE — Telephone Encounter (Signed)
Pt informed

## 2011-08-30 ENCOUNTER — Encounter (HOSPITAL_COMMUNITY): Payer: Self-pay | Admitting: Emergency Medicine

## 2011-08-30 ENCOUNTER — Emergency Department (HOSPITAL_COMMUNITY): Payer: Medicare Other

## 2011-08-30 ENCOUNTER — Emergency Department (HOSPITAL_COMMUNITY)
Admission: EM | Admit: 2011-08-30 | Discharge: 2011-08-30 | Disposition: A | Discharge status: Home / Self Care | Payer: Medicare Other | Attending: Emergency Medicine | Admitting: Emergency Medicine

## 2011-08-30 DIAGNOSIS — F0281 Dementia in other diseases classified elsewhere with behavioral disturbance: Secondary | ICD-10-CM | POA: Insufficient documentation

## 2011-08-30 DIAGNOSIS — Z79899 Other long term (current) drug therapy: Secondary | ICD-10-CM | POA: Insufficient documentation

## 2011-08-30 DIAGNOSIS — M79609 Pain in unspecified limb: Secondary | ICD-10-CM | POA: Insufficient documentation

## 2011-08-30 DIAGNOSIS — M79644 Pain in right finger(s): Secondary | ICD-10-CM

## 2011-08-30 DIAGNOSIS — E039 Hypothyroidism, unspecified: Secondary | ICD-10-CM | POA: Insufficient documentation

## 2011-08-30 DIAGNOSIS — M25559 Pain in unspecified hip: Secondary | ICD-10-CM | POA: Insufficient documentation

## 2011-08-30 DIAGNOSIS — F02818 Dementia in other diseases classified elsewhere, unspecified severity, with other behavioral disturbance: Secondary | ICD-10-CM | POA: Insufficient documentation

## 2011-08-30 DIAGNOSIS — I10 Essential (primary) hypertension: Secondary | ICD-10-CM | POA: Insufficient documentation

## 2011-08-30 DIAGNOSIS — G309 Alzheimer's disease, unspecified: Secondary | ICD-10-CM | POA: Insufficient documentation

## 2011-08-30 DIAGNOSIS — F0280 Dementia in other diseases classified elsewhere without behavioral disturbance: Secondary | ICD-10-CM | POA: Insufficient documentation

## 2011-08-30 DIAGNOSIS — G319 Degenerative disease of nervous system, unspecified: Secondary | ICD-10-CM | POA: Insufficient documentation

## 2011-08-30 DIAGNOSIS — W19XXXA Unspecified fall, initial encounter: Secondary | ICD-10-CM

## 2011-08-30 DIAGNOSIS — M25551 Pain in right hip: Secondary | ICD-10-CM

## 2011-08-30 NOTE — ED Notes (Signed)
Pt. Stated, I fell last night and my lt hip is in a little pain and my rt. thumb

## 2011-08-30 NOTE — ED Provider Notes (Signed)
History     CSN: [id number]  Arrival date & time 08/30/11  1054   First MD Initiated Contact with Patient 08/30/11 1125      Chief Complaint  Patient presents with  . Hip Pain    (Consider location/radiation/quality/duration/timing/severity/associated sxs/prior treatment) HPI History is obtained from the patient and family. 78 year old male with past medical history of Alzheimer's dementia presents with pain to the right hip and thumb after suffering a fall. Family states that they were at home with him last night when they heard him fall while walking in another room. They were able to get to him quickly and helped him up. They say he struck the back of his head but don't think that he lost consciousness. He has been acting normally since the event. He is not on any blood thinners. He has complained of achy pain to his right hip, and points to the lateral/posterior area when asked where the pain is located. Is able to bear weight and walk as normal. Denies distal numbness or weakness Also has noticed bruising to his right thumb. Denies any loss of range of motion in the thumb, numbness, weakness. Denies any chest pain, shortness of breath, nausea, vomiting. Has not had any abdominal pain. Denies neck or back pain. No other known injuries.  Past Medical History  Diagnosis Date  . Arthritis   . Alzheimer's dementia with behavioral disturbance   . Hypertension   . Hypothyroid     Past Surgical History  Procedure Date  . Reconstruction medial collateral ligament elbow w/ tendon graft     No family history on file.  History  Substance Use Topics  . Smoking status: Never Smoker   . Smokeless tobacco: Not on file  . Alcohol Use: No      Review of Systems  Constitutional: Negative.   HENT: Negative for neck pain.   Eyes: Negative for photophobia and visual disturbance.  Respiratory: Negative for shortness of breath.   Cardiovascular: Negative for chest pain.  Gastrointestinal:  Negative for nausea, vomiting and abdominal pain.  Musculoskeletal: Positive for myalgias and arthralgias. Negative for gait problem.  Neurological: Negative for dizziness, weakness and headaches.    Allergies  Review of patient's allergies indicates no known allergies.  Home Medications   Current Outpatient Rx  Name Route Sig Dispense Refill  . ASPIRIN 81 MG PO TBEC Oral Take 81 mg by mouth daily.      Marland Kitchen CITALOPRAM HYDROBROMIDE 20 MG PO TABS Oral Take 20 mg by mouth daily.      Marland Kitchen LEVOTHYROXINE SODIUM 88 MCG PO TABS Oral Take 88 mcg by mouth daily.    Marland Kitchen LOSARTAN POTASSIUM 100 MG PO TABS Oral Take 100 mg by mouth daily.      . MELOXICAM 7.5 MG PO TABS Oral Take 7.5 mg by mouth daily.      Marland Kitchen TAMSULOSIN HCL 0.4 MG PO CAPS Oral Take 0.4 mg by mouth daily.    Marland Kitchen VITAMIN B-12 1000 MCG PO TABS Oral Take 1,000 mcg by mouth daily.      Marland Kitchen VITAMIN D 400 UNITS PO TABS Oral Take 400 Units by mouth daily.        BP 129/78  Pulse 63  Temp(Src) 97.9 F (36.6 C) (Oral)  Resp 16  SpO2 96%  Physical Exam  Nursing note and vitals reviewed. Constitutional: He is oriented to person, place, and time. He appears well-developed and well-nourished. No distress.  HENT:  Head: Normocephalic and atraumatic.  Right Ear: External ear normal.  Left Ear: External ear normal.  Mouth/Throat: Oropharynx is clear and moist. No oropharyngeal exudate.       Tender to palpation of the face and scalp. No battles or raccoons sign. Tympanic membranes normal bilaterally.  Eyes: Conjunctivae and EOM are normal. Pupils are equal, round, and reactive to light.  Neck: Normal range of motion.  Cardiovascular: Normal rate, regular rhythm and normal heart sounds.   Pulmonary/Chest: Effort normal and breath sounds normal. He exhibits no tenderness.  Abdominal: Soft.  Musculoskeletal:       Spine: No palpable stepoff, crepitus, or gross deformity appreciated. No appreciable spasm of paravertebral muscles. No midline  tenderness.  Right hip/pelvis: No palpable step-off, crepitus, deformity. There are no ecchymoses, edema, erythema noted. Full range of motion at the hip. No leg shortening or rotation noted. Slight bony tenderness noted just inferior to the ASIS. Right lower extremity is neurovascularly intact with sensory intact to light touch. Good DP/PT pulses. Capillary refill less than 3 seconds.   Neurological: He is alert and oriented to person, place, and time. No cranial nerve deficit. He exhibits normal muscle tone. Coordination normal.  Skin: He is not diaphoretic.    ED Course  Procedures (including critical care time)  Labs Reviewed - No data to display Dg Pelvis 1-2 Views  08/30/2011  *RADIOLOGY REPORT*  Clinical Data: Fall with pelvic pain.  PELVIS - 1-2 VIEW  Comparison: None  Findings: No evidence of acute fracture, subluxation or dislocation identified.  No radio-opaque foreign bodies are present.  No focal bony lesions are noted.  Degenerative changes in both hips are noted, right greater left.  IMPRESSION: No acute bony abnormality.  Original Report Authenticated By: Rosendo Gros, M.D.   Dg Sacrum/coccyx  08/30/2011  *RADIOLOGY REPORT*  Clinical Data: 78 year old male with fall and sacral/coccygeal pain.  SACRUM AND COCCYX - 2+ VIEW  Comparison: None  Findings: No evidence of acute fracture, subluxation or dislocation identified.  No radio-opaque foreign bodies are present.  No focal bony lesions are noted.  IMPRESSION: No acute bony abnormality.  Original Report Authenticated By: Rosendo Gros, M.D.   Dg Hip Complete Right  08/30/2011  *RADIOLOGY REPORT*  Clinical Data: 78 year old male with right hip pain following fall.  RIGHT HIP - COMPLETE 2+ VIEW  Comparison: None  Findings: No evidence of acute fracture, subluxation or dislocation identified.  No radio-opaque foreign bodies are present.  No focal bony lesions are noted.  Mild degenerative changes in both hips are noted, right greater than  left.  IMPRESSION: No evidence of acute abnormality.  Mild degenerative changes in both hips.  Original Report Authenticated By: Rosendo Gros, M.D.   Ct Head Wo Contrast  08/30/2011  *RADIOLOGY REPORT*  Clinical Data: History of fall with head trauma.  CT HEAD WITHOUT CONTRAST  Technique:  Contiguous axial images were obtained from the base of the skull through the vertex without contrast.  Comparison: Brain MRI 12/05/2010.  Findings: No acute displaced skull fractures are identified.  No definite acute intracranial abnormality.  Specifically, no signs of acute intracranial hemorrhage, definite evidence of acute/subacute cerebral ischemia, focal mass, mass effect, hydrocephalus or abnormal intra or extra-axial fluid collections.  There is moderate cerebral and cerebellar atrophy with associated ex vacuo dilatation of the ventricular system (unchanged).  Extensive areas of patchy and confluent decreased attenuation are noted throughout the deep and periventricular white matter of the cerebral hemispheres bilaterally, most consistent with chronic  microvascular ischemic disease.  IMPRESSION: 1.  No displaced skull fractures or signs of significant acute intracranial trauma. 2.  Appearance of the brain is very similar to prior brain MRI 12/05/2010, as detailed above.  Original Report Authenticated By: Florencia Reasons, M.D.   Dg Finger Thumb Right  08/30/2011  *RADIOLOGY REPORT*  Clinical Data: 78 year old male with right thumb pain following fall.  RIGHT THUMB 2+V  Comparison: None  Findings: There is no evidence of fracture, subluxation or dislocation. Moderate to severe degenerative changes at the first carpometacarpal joint noted. No suspicious focal bony lesions are present. No radiopaque foreign bodies are identified.  IMPRESSION: No evidence of acute bony abnormality.  Severe degenerative changes of the first carpometacarpal joint.  Original Report Authenticated By: Rosendo Gros, M.D.     1. Fall     2. Right hip pain   3. Pain of right thumb       MDM  78 year old male who present status post fall last evening. Plain films of the hip, pelvis, and thumb, as well as a CT of the head, were all negative for acute findings. These findings were discussed with the patient and family. Will treat symptomatically for pain. Return precautions discussed.  Plan d/w Dr. Jeraldine Loots who saw pt with me.      Grant Fontana, [place] 08/31/11 1422

## 2011-08-30 NOTE — Discharge Instructions (Signed)
Your x-rays today were normal and did not show any signs of bleeding or broken bones. Please followup with Dr. Cyndie Chime office if needed. You may take over-the-counter pain medicine such as Tylenol as needed. Return to the ED for worsening condition or any further concerns.  Hip Pain The hips join the upper legs to the lower pelvis. The bones, cartilage, tendons, and muscles of the hip joint perform a lot of work each day holding your body weight and allowing you to move around. Hip pain is a common symptom. It can range from a minor ache to severe pain on 1 or both hips. Pain may be felt on the inside of the hip joint near the groin, or the outside near the buttocks and upper thigh. There may be swelling or stiffness as well. It occurs more often when a person walks or performs activity. There are many reasons hip pain can develop. CAUSES  It is important to work with your caregiver to identify the cause since many conditions can impact the bones, cartilage, muscles, and tendons of the hips. Causes for hip pain include:  Broken (fractured) bones.   Separation of the thighbone from the hip socket (dislocation).   Torn cartilage of the hip joint.   Swelling (inflammation) of a tendon (tendonitis), the sac within the hip joint (bursitis), or a joint.   A weakening in the abdominal wall (hernia), affecting the nerves to the hip.   Arthritis in the hip joint or lining of the hip joint.   Pinched nerves in the back, hip, or upper thigh.   A bulging disc in the spine (herniated disc).   Rarely, bone infection or cancer.  DIAGNOSIS  The location of your hip pain will help your caregiver understand what may be causing the pain. A diagnosis is based on your medical history, your symptoms, results from your physical exam, and results from diagnostic tests. Diagnostic tests may include X-ray exams, a computerized magnetic scan (magnetic resonance imaging, MRI), or bone scan. TREATMENT  Treatment  will depend on the cause of your hip pain. Treatment may include:  Limiting activities and resting until symptoms improve.   Crutches or other walking supports (a cane or brace).   Ice, elevation, and compression.   Physical therapy or home exercises.   Shoe inserts or special shoes.   Losing weight.   Medications to reduce pain.   Undergoing surgery.  HOME CARE INSTRUCTIONS   Only take over-the-counter or prescription medicines for pain, discomfort, or fever as directed by your caregiver.   Put ice on the injured area:   Put ice in a plastic bag.   Place a towel between your skin and the bag.   Leave the ice on for 15 to 20 minutes at a time, 3 to 4 times a day.   Keep your leg raised (elevated) when possible to lessen swelling.   Avoid activities that cause pain.   Follow specific exercises as directed by your caregiver.   Sleep with a pillow between your legs on your most comfortable side.   Record how often you have hip pain, the location of the pain, and what it feels like. This information may be helpful to you and your caregiver.   Ask your caregiver about returning to work or sports and whether you should drive.   Follow up with your caregiver for further exams, therapy, or testing as directed.  SEEK MEDICAL CARE IF:   Your pain or swelling continues or worsens after  1 week.   You are feeling unwell or have chills.   You have increasing difficulty with walking.   You have a loss of sensation or other new symptoms.   You have questions or concerns.  SEEK IMMEDIATE MEDICAL CARE IF:   You cannot put weight on the affected hip.   You have fallen.   You have a sudden increase in pain and swelling in your hip.   You have a fever.  MAKE SURE YOU:   Understand these instructions.   Will watch your condition.   Will get help right away if you are not doing well or get worse.  Document Released: 11/27/2009 Document Revised: 05/29/2011 Document  Reviewed: 11/27/2009 [facility] Patient Information 2012 [place], [place].

## 2011-08-31 NOTE — ED Provider Notes (Signed)
Medical screening examination/treatment/procedure(s) were conducted as a shared visit with non-physician practitioner(s) and myself.  I personally evaluated the patient during the encounter On my evaluation the patient was in no distress, the patient's family notes that he is acting per his baseline.  The patient's redo x-rays were reviewed with the family.  The patient was discharged back to his nursing home in stable condition to  Gerhard Munch, MD 08/31/11 1439

## 2011-09-29 ENCOUNTER — Ambulatory Visit (INDEPENDENT_AMBULATORY_CARE_PROVIDER_SITE_OTHER): Payer: Medicare Other | Admitting: Family Medicine

## 2011-09-29 ENCOUNTER — Encounter: Payer: Self-pay | Admitting: Family Medicine

## 2011-09-29 VITALS — BP 130/88 | HR 57 | Temp 97.4°F | Ht 67.0 in | Wt 149.1 lb

## 2011-09-29 DIAGNOSIS — I10 Essential (primary) hypertension: Secondary | ICD-10-CM

## 2011-09-29 DIAGNOSIS — R5383 Other fatigue: Secondary | ICD-10-CM

## 2011-09-29 DIAGNOSIS — Z0000 Encounter for general adult medical examination without abnormal findings: Secondary | ICD-10-CM

## 2011-09-29 DIAGNOSIS — E039 Hypothyroidism, unspecified: Secondary | ICD-10-CM

## 2011-09-29 DIAGNOSIS — G309 Alzheimer's disease, unspecified: Secondary | ICD-10-CM

## 2011-09-29 DIAGNOSIS — F0280 Dementia in other diseases classified elsewhere without behavioral disturbance: Secondary | ICD-10-CM

## 2011-09-29 DIAGNOSIS — R5381 Other malaise: Secondary | ICD-10-CM

## 2011-09-29 DIAGNOSIS — F068 Other specified mental disorders due to known physiological condition: Secondary | ICD-10-CM

## 2011-09-29 DIAGNOSIS — Z23 Encounter for immunization: Secondary | ICD-10-CM

## 2011-09-29 LAB — HEPATIC FUNCTION PANEL
AST: 22 U/L (ref 0–37)
Albumin: 4.2 g/dL (ref 3.5–5.2)
Alkaline Phosphatase: 65 U/L (ref 39–117)
Bilirubin, Direct: 0.1 mg/dL (ref 0.0–0.3)
Total Bilirubin: 0.5 mg/dL (ref 0.3–1.2)

## 2011-09-29 LAB — CBC WITH DIFFERENTIAL/PLATELET
Basophils Absolute: 0 10*3/uL (ref 0.0–0.1)
Eosinophils Absolute: 0.1 10*3/uL (ref 0.0–0.7)
Hemoglobin: 14.2 g/dL (ref 13.0–17.0)
Lymphocytes Relative: 12.3 % (ref 12.0–46.0)
MCHC: 33.3 g/dL (ref 30.0–36.0)
Monocytes Relative: 8.6 % (ref 3.0–12.0)
Neutrophils Relative %: 76.6 % (ref 43.0–77.0)
Platelets: 139 10*3/uL — ABNORMAL LOW (ref 150.0–400.0)
RDW: 13.9 % (ref 11.5–14.6)

## 2011-09-29 LAB — BASIC METABOLIC PANEL
BUN: 20 mg/dL (ref 6–23)
CO2: 28 mEq/L (ref 19–32)
Calcium: 9.7 mg/dL (ref 8.4–10.5)
Creatinine, Ser: 1.2 mg/dL (ref 0.4–1.5)
GFR: 62.79 mL/min (ref >60.00)
Glucose, Bld: 86 mg/dL (ref 70–99)
Sodium: 145 mEq/L (ref 135–145)

## 2011-09-29 NOTE — Progress Notes (Signed)
Patient Name: Dylan Roach Date of Birth: April 12, 1924 Age: 78 y.o. Medical Record Number: [id number] Gender: male Date of Encounter: 09/29/2011  History of Present Illness:  Dylan Roach is a 78 y.o. very pleasant male patient who presents with the following:  Medicare CPX and f/u medical problems:  Alzheimer's, balance: Larey Seat a few weeks ago, went to ER. Notes and films reviewed. No fractures. Now has walker.  Falling quite a bit right now. Has a walker right now. Also will hol dhands. Locks on all doors.  Wife moved back in bedroom now with twin beds. Celexa is working. (decreased sexual desire) Alzheimer's is progressing is on Namenda. They have also been to the memory clinic at [facility].  31 deg -- with PJ's and no shoes. Has a call in oto an electronic bracelet.   Thyroid: Forgetful, prob from AD.  No cold / heat intolerance, dry skin, hair loss. No goiter.  Lab Results  Component Value Date   TSH 1.56 10/10/2010   HTN: Tolerating all medications without side effects Stable and at goal No CP, no sob. No HA.  BP Readings from Last 3 Encounters:  09/29/11 130/88  08/30/11 128/70  03/31/11 120/70    Basic Metabolic Panel:    Component Value Date/Time   NA 143 01/09/2011 0827   K 4.0 01/09/2011 0827   CL 104 01/09/2011 0827   CO2 29 01/09/2011 0827   BUN 15 01/09/2011 0827   CREATININE 1.2 01/09/2011 0827   GLUCOSE 94 01/09/2011 0827   CALCIUM 9.1 01/09/2011 0827    Patient Active Problem List  Diagnoses  . HYPOTHYROIDISM  . ESSENTIAL HYPERTENSION  . ARTHRITIS  . CHICKENPOX, HX OF  . Alzheimer's dementia  . Weight loss, non-intentional  . Fatigue   Past Medical History  Diagnosis Date  . Arthritis   . Alzheimer's dementia with behavioral disturbance   . Hypertension   . Hypothyroid    Past Surgical History  Procedure Date  . Reconstruction medial collateral ligament elbow w/ tendon graft    History  Substance Use Topics  . Smoking status: Never Smoker     . Smokeless tobacco: Not on file  . Alcohol Use: No   No family history on file. No Known Allergies Current Outpatient Prescriptions on File Prior to Visit  Medication Sig Dispense Refill  . aspirin 81 MG EC tablet Take 81 mg by mouth daily.        . citalopram (CELEXA) 20 MG tablet Take 20 mg by mouth daily.        Marland Kitchen levothyroxine (SYNTHROID, LEVOTHROID) 88 MCG tablet Take 88 mcg by mouth daily.      Marland Kitchen losartan (COZAAR) 100 MG tablet Take 100 mg by mouth daily.        . memantine (NAMENDA) 10 MG tablet Take 10 mg by mouth daily.      . Tamsulosin HCl (FLOMAX) 0.4 MG CAPS Take 0.4 mg by mouth daily.      . vitamin B-12 (CYANOCOBALAMIN) 1000 MCG tablet Take 1,000 mcg by mouth daily.        . vitamin D, CHOLECALCIFEROL, 400 UNITS tablet Take 400 Units by mouth daily.        Marland Kitchen DISCONTD: donepezil (ARICEPT) 5 MG tablet Take 1 tablet (5 mg total) by mouth at bedtime as needed.  30 tablet  5     Past Medical History, Surgical History, Social History, Family History, Problem List, Medications, and Allergies have been reviewed and updated if relevant.  Review of Systems:  General: Denies fever, chills, sweats. No significant weight loss - EATING MUCH BETTER. FATIGUE. MEMORY  Eyes: Denies blurring,significant itching ENT: Denies earache, sore throat, and hoarseness. Cardiovascular: Denies chest pains, palpitations, dyspnea on exertion Respiratory: Denies cough, dyspnea at rest,wheeezing Breast: no concerns about lumps GI: Denies nausea, vomiting, diarrhea, constipation, change in bowel habits, abdominal pain, melena, hematochezia GU: Denies penile discharge. NOT SEXUALLY Denies back pain, joint painACTIVE. INCONT APPROX 50-70% OF THE TIME Musculoskeletal: RECENT FALLS Derm: Denies rash, itching Neuro: Denies  paresthesias,  frequent headaches. AS ABOVE. WORSENING DEMENTIA Psych: SOME SADNESS WITH DECREASED MEMORY AND FUNCTION Endocrine: Denies cold intolerance, heat intolerance,  polydipsia Heme: Denies enlarged lymph nodes Allergy: No hayfever   Physical Examination: Filed Vitals:   09/29/11 0829  BP: 130/88  Pulse: 57  Temp: 97.4 F (36.3 C)  TempSrc: Oral  Height: 5\' 7"  (1.702 m)  Weight: 149 lb 1.9 oz (67.64 kg)  SpO2: 98%    Body mass index is 23.36 kg/(m^2).   Wt Readings from Last 3 Encounters:  09/29/11 149 lb 1.9 oz (67.64 kg)  03/31/11 144 lb (65.318 kg)  01/01/11 140 lb 1.9 oz (63.558 kg)    GEN: well developed, well nourished, no acute distress Eyes: conjunctiva and lids normal, PERRLA, EOMI ENT: TM clear, nares clear, oral exam WNL Neck: supple, no lymphadenopathy, no thyromegaly, no JVD Pulm: clear to auscultation and percussion, respiratory effort normal CV: regular rate and rhythm, S1-S2, no murmur, rub or gallop, no bruits, peripheral pulses normal and symmetric, no cyanosis, clubbing, edema or varicosities Chest: no scars, masses GI: soft, non-tender; no hepatosplenomegaly, masses; active bowel sounds all quadrants GU: no hernia, testicular mass, penile discharge Lymph: no cervical, axillary or inguinal adenopathy MSK: gait normal, muscle tone and strength WNL, no joint swelling, effusions, discoloration, crepitus  SKIN: clear, good turgor, color WNL, no rashes, lesions, or ulcerations Neuro: normal mental status, normal strength, sensation, and motion Psych: alert; oriented to person, place and time, normally interactive and not anxious or depressed in appearance.   Assessment and Plan:  1. Routine general medical examination at a health care facility    2. Immunization due  Pneumococcal polysaccharide vaccine 23-valent greater than or equal to 2yo subcutaneous/IM  3. HYPOTHYROIDISM : check levels TSH  4. ESSENTIAL HYPERTENSION : stable, check f/u labs Basic metabolic panel  5. Fatigue - f/u labs, prob all from age CBC with Differential, Hepatic function panel  6. Alzheimer's dementia : discussed at length, relatively  stable at home. Son moved in to help. Wife coping reasonably OK    I have personally reviewed the Medicare Annual Wellness questionnaire and have noted 1. The patient's medical and social history 2. Their use of alcohol, tobacco or illicit drugs 3. Their current medications and supplements 4. The patient's functional ability including ADL's, fall risks, home safety risks and hearing or visual             impairment. 5. Diet and physical activities 6. Evidence for depression or mood disorders  The patients weight, height, BMI and visual acuity have been recorded in the chart I have made referrals, counseling and provided education to the patient based review of the above and I have provided the pt with a written personalized care plan for preventive services.  I have provided the patient with a copy of your personalized plan for preventive services. Instructed to take the time to review along with their updated medication list.   zostavax rx  given  Orders Today: Orders Placed This Encounter  Procedures  . Pneumococcal polysaccharide vaccine 23-valent greater than or equal to 2yo subcutaneous/IM  . Basic metabolic panel  . CBC with Differential  . Hepatic function panel  . TSH    Medications Today: Meds ordered this encounter  Medications  . memantine (NAMENDA) 10 MG tablet    Sig: Take 10 mg by mouth daily.

## 2011-09-30 ENCOUNTER — Encounter: Payer: Self-pay | Admitting: *Deleted

## 2011-10-21 ENCOUNTER — Other Ambulatory Visit: Payer: Self-pay | Admitting: *Deleted

## 2011-10-21 MED ORDER — LOSARTAN POTASSIUM 100 MG PO TABS: 100.0000 mg | ORAL_TABLET | Freq: Every day | ORAL | Status: DC

## 2011-11-03 ENCOUNTER — Telehealth: Payer: Self-pay | Admitting: Family Medicine

## 2011-11-03 MED ORDER — CITALOPRAM HYDROBROMIDE 40 MG PO TABS: 40.0000 mg | ORAL_TABLET | Freq: Every day | ORAL | Status: DC

## 2011-11-03 NOTE — Telephone Encounter (Signed)
Yes, that is ok for him to be on 40 mg  Ok to send in celexa 40 mg, 1 po daily  #30, 11 refills

## 2011-11-03 NOTE — Telephone Encounter (Signed)
rx sent to pharmacy. patient advised 

## 2011-11-03 NOTE — Telephone Encounter (Signed)
Patient wife calling to let you know that she has had to increase the celexa. Because patient has gotten worse. Patient is now taken 40 mg daily it is okay to send this in to [facility] pharmacy?

## 2011-12-29 ENCOUNTER — Other Ambulatory Visit: Payer: Self-pay | Admitting: *Deleted

## 2011-12-29 MED ORDER — LEVOTHYROXINE SODIUM 88 MCG PO TABS: 88.0000 ug | ORAL_TABLET | Freq: Every day | ORAL | Status: DC

## 2011-12-29 MED ORDER — TAMSULOSIN HCL 0.4 MG PO CAPS: 0.4000 mg | ORAL_CAPSULE | Freq: Every day | ORAL | Status: DC

## 2012-02-12 ENCOUNTER — Telehealth: Payer: Self-pay

## 2012-02-12 NOTE — Telephone Encounter (Signed)
pts wife request handicap placard for Mr. Dylan Roach. Pt cannot walk without assistance. Call when ready for pick up. Form in Dr Copland's in box.

## 2012-05-28 ENCOUNTER — Telehealth: Payer: Self-pay | Admitting: Family Medicine

## 2012-05-28 NOTE — Telephone Encounter (Signed)
Patient Information:  Caller Name: Alona Bene  Phone: [phone]  Patient: Dylan Roach, Dylan Roach  Gender: Male  DOB: Jul 12, 1923  Age: 78 Years  PCP: Hannah Beat (Family Practice)   Symptoms  Reason For Call & Symptoms: Wife states her husband has Alzehimers and dementia. She states that he "started thinking he doesn't live at home, he wants to leave and gets angry".  She states he locked the door and climbed out the window and walked to his daughters and begged her to take him to the bus. He wants to go home to his mothers house . She is deceased.  He is becoming more aggressive and agitated. Wife is asking for Resperidol . Her brother's wife has Alzheimers and it works for her .She is asking for RX.  She does state worsening in the evening  Reviewed Health History In EMR: Yes  Reviewed Medications In EMR: Yes  Reviewed Allergies In EMR: Yes  Reviewed Surgeries / Procedures: No  Date of Onset of Symptoms: 05/25/2012  Treatments Tried: Redirect him and she sometimes has to call her son to intercede.  Treatments Tried Worked: No  Guideline(s) Used:  Confusion - Delirium  Disposition Per Guideline:   See Today or Tomorrow in Office  Reason For Disposition Reached:   Longstanding confusion (e.g., dementia, stroke) and worsening  Advice Given:  Call Back If:  You (i.e., patient, family member) become worse.  Sundowning  Definition: Sundowning or "Sundown Syndrome" refers to the increased confusion that is sometimes seen in the late afternoon and evening in individuals with Alzheimer's Dementia and certain other brain conditions.  Symptoms: In addition to increased confusion there may be agitation, paranoia, hallucinations, and wandering (i.e., leaving house and getting lost).  Sundowning - General Care Advice:  Arrange for support from family, friends, and other caregivers.  Ensure adequate room lighting when awake. Consider a small night light for use  during the night.  Promote  orientation by having an easily visible clock and calendar in the room.  Provide a structured daily routine and a familiar environment (photos of family,  favorite possessions).  Call Back If:  You have more questions.  You (i.e., patient, family member) become worse.  Office Follow Up:  Does the office need to follow up with this patient?: Yes  Instructions For The Office: Please contact. REQUESTIN PRESCRIPTION WILLING TO BE SEEN IF NEEDED  Patient Refused Recommendation:  Patient Requests Prescription  Wants prescription for her husband. Willing to be seen if needed  RN Note:  Requesting prescription . PLEASE CONTACT WIFE FOR ASSISTANCE

## 2012-05-29 MED ORDER — RISPERIDONE 0.5 MG PO TABS: 0.5000 mg | ORAL_TABLET | Freq: Two times a day (BID) | ORAL | Status: DC

## 2012-05-29 NOTE — Telephone Encounter (Signed)
Sent in for them and [facility].  Can you confirm with Ms. Gappa that they got my message and meds. I should see him back in the next 2-3 weeks, so we can check how he does, reassess, and see if we need to change / increase meds.   Hannah Beat, MD 05/29/2012, 5:49 PM

## 2012-05-31 NOTE — Telephone Encounter (Signed)
Patients wife advised and will call for appt

## 2012-06-11 ENCOUNTER — Telehealth: Payer: Self-pay | Admitting: Family Medicine

## 2012-06-11 NOTE — Telephone Encounter (Signed)
Wants Dr.  Patsy Lager to know that patient is doing very well on the Respirdol and would like to continue the medication going forward.  No changes to report at this time.  Advised appt within next week or so per Epic note 05/29/12; states difficult to come to office.  Wants note to Dr.  Patsy Lager.  Info to office for provider review.  Krs/can

## 2012-06-13 NOTE — Telephone Encounter (Signed)
I have not seen him in about 8 months.  This is non-emergent, but please schedule f/u in next couple months. Glad he is doing better.

## 2012-06-14 NOTE — Telephone Encounter (Signed)
PATIENTS WIFE ADVISED

## 2012-08-10 ENCOUNTER — Other Ambulatory Visit: Payer: Self-pay | Admitting: *Deleted

## 2012-08-10 MED ORDER — TAMSULOSIN HCL 0.4 MG PO CAPS: 0.4000 mg | ORAL_CAPSULE | Freq: Every day | ORAL | Status: DC

## 2012-09-01 ENCOUNTER — Telehealth: Payer: Self-pay

## 2012-09-01 ENCOUNTER — Encounter: Payer: Self-pay | Admitting: Family Medicine

## 2012-09-01 ENCOUNTER — Ambulatory Visit (INDEPENDENT_AMBULATORY_CARE_PROVIDER_SITE_OTHER): Payer: Medicare Other | Admitting: Family Medicine

## 2012-09-01 VITALS — BP 120/64 | HR 67 | Temp 98.5°F | Ht 67.0 in | Wt 161.5 lb

## 2012-09-01 DIAGNOSIS — G309 Alzheimer's disease, unspecified: Secondary | ICD-10-CM

## 2012-09-01 DIAGNOSIS — F0280 Dementia in other diseases classified elsewhere without behavioral disturbance: Secondary | ICD-10-CM

## 2012-09-01 MED ORDER — QUETIAPINE FUMARATE 100 MG PO TABS: 100.0000 mg | ORAL_TABLET | Freq: Two times a day (BID) | ORAL | Status: DC
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
## 2012-09-01 NOTE — Telephone Encounter (Signed)
Patients wife had appt today and can not bring him in so she made a follow up appt with you on Monday. If he gets worse she will call 911

## 2012-09-01 NOTE — Telephone Encounter (Signed)
This patient needs to be seen in the office

## 2012-09-01 NOTE — Telephone Encounter (Signed)
i am seeing today   Hannah Beat, MD 09/01/2012, 1:11 PM
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
## 2012-09-06 ENCOUNTER — Ambulatory Visit: Payer: Medicare Other | Admitting: Family Medicine

## 2012-09-09 ENCOUNTER — Encounter: Payer: Self-pay | Admitting: Family Medicine

## 2012-10-07 ENCOUNTER — Ambulatory Visit (INDEPENDENT_AMBULATORY_CARE_PROVIDER_SITE_OTHER): Payer: Medicare Other | Admitting: Family Medicine

## 2012-10-07 ENCOUNTER — Encounter: Payer: Self-pay | Admitting: Family Medicine

## 2012-10-07 VITALS — BP 140/80 | HR 72 | Temp 98.4°F | Wt 165.0 lb

## 2012-10-07 DIAGNOSIS — S2239XA Fracture of one rib, unspecified side, initial encounter for closed fracture: Secondary | ICD-10-CM

## 2012-10-07 DIAGNOSIS — S2231XA Fracture of one rib, right side, initial encounter for closed fracture: Secondary | ICD-10-CM

## 2012-10-07 MED ORDER — MELOXICAM 15 MG PO TABS: 15.0000 mg | ORAL_TABLET | Freq: Every day | ORAL | Status: DC

## 2012-10-07 NOTE — Progress Notes (Signed)
Nature conservation officer at [facility] [address] [place] [address] Phone: [phone] Fax: [phone]  Date:  10/07/2012   Name:  Dylan Roach   DOB:  11/30/1923   MRN:  [id number] Gender: male Age: 79 y.o.  Primary Physician:  Hannah Beat, MD  Evaluating MD: Hannah Beat, MD   Chief Complaint: fell last night, rt side rib pain   History of Present Illness:  Dylan Roach is a 79 y.o. pleasant patient who presents with the following:  Yesterday, about five or so, and he went down. Fell on knees and onto his right side. Let him lie on the floor for a while and it hurt too much to move. The patient's wife is the historian, as he has advanced Alzheimer's, and has no memory of the event. He has been having pain and had difficulty sleeping last night.  He has been doing well and is much calmer after we placed him on some Seroquel.  Patient Active Problem List  Diagnosis  . HYPOTHYROIDISM  . ESSENTIAL HYPERTENSION  . ARTHRITIS  . CHICKENPOX, HX OF  . Alzheimer's dementia  . Weight loss, non-intentional  . Fatigue    Past Medical History  Diagnosis Date  . Arthritis   . Alzheimer's dementia with behavioral disturbance   . Hypertension   . Hypothyroid     Past Surgical History  Procedure Laterality Date  . Reconstruction medial collateral ligament elbow w/ tendon graft      History   Social History  . Marital Status: Married    Spouse Name: N/A    Number of Children: N/A  . Years of Education: N/A   Occupational History  . retired    Social History Main Topics  . Smoking status: Never Smoker   . Smokeless tobacco: Never Used  . Alcohol Use: No  . Drug Use: No  . Sexually Active: Not on file   Other Topics Concern  . Not on file   Social History Narrative  . No narrative on file    No family history on file.  No Known Allergies  Medication list has been reviewed and updated.  Outpatient Prescriptions Prior to Visit    Medication Sig Dispense Refill  . aspirin 81 MG EC tablet Take 81 mg by mouth daily.        . citalopram (CELEXA) 40 MG tablet Take 1 tablet (40 mg total) by mouth daily.  30 tablet  11  . levothyroxine (SYNTHROID, LEVOTHROID) 88 MCG tablet Take 1 tablet (88 mcg total) by mouth daily.  30 tablet  11  . losartan (COZAAR) 100 MG tablet Take 1 tablet (100 mg total) by mouth daily.  30 tablet  11  . QUEtiapine (SEROQUEL) 100 MG tablet Take 1 tablet (100 mg total) by mouth 2 (two) times daily.  60 tablet  2  . Tamsulosin HCl (FLOMAX) 0.4 MG CAPS Take 1 capsule (0.4 mg total) by mouth daily.  30 capsule  6  . vitamin B-12 (CYANOCOBALAMIN) 1000 MCG tablet Take 1,000 mcg by mouth daily.        . vitamin D, CHOLECALCIFEROL, 400 UNITS tablet Take 400 Units by mouth daily.        . memantine (NAMENDA) 10 MG tablet Take 10 mg by mouth daily.       No facility-administered medications prior to visit.    Review of Systems:  Dementia. Rib pain. Overall, relatively pleasant. Afebrile.  Physical Examination: BP 140/80  Pulse 72  Temp(Src) 98.4 F (36.9 C) (Oral)  Wt 165 lb (74.844 kg)  BMI 25.84 kg/m2  SpO2 98%  Ideal Body Weight:     GEN: WDWN, NAD, Non-toxic, Not oriented. HEENT: Atraumatic, Normocephalic.  Ears and Nose: No external deformity. Chest wall: Ferrol sinus positive. Anterior ribs approximately at rib #7 is markedly tender to palpation. EXTR: No clubbing/cyanosis/edema NEURO: Normal gait.  PSYCH: Normally interactive. Conversant.   Assessment and Plan:  Rib fracture, right, closed, initial encounter  Conservative management. Avoid sedating medicines and narcotics. Rib belt.  Orders Today:  No orders of the defined types were placed in this encounter.    Updated Medication List: (Includes new medications, updates to list, dose adjustments) Meds ordered this encounter  Medications  . meloxicam (MOBIC) 15 MG tablet    Sig: Take 1 tablet (15 mg total) by mouth daily.     Dispense:  30 tablet    Refill:  0    Medications Discontinued: Medications Discontinued During This Encounter  Medication Reason  . memantine (NAMENDA) 10 MG tablet Patient Preference      Signed, Quantavius Humm T. Jasmeet Gehl, MD 10/07/2012 2:43 PM

## 2012-10-28 ENCOUNTER — Other Ambulatory Visit: Payer: Self-pay | Admitting: *Deleted

## 2012-10-28 MED ORDER — CITALOPRAM HYDROBROMIDE 40 MG PO TABS: 40.0000 mg | ORAL_TABLET | Freq: Every day | ORAL | Status: DC

## 2012-11-04 ENCOUNTER — Other Ambulatory Visit: Payer: Self-pay | Admitting: *Deleted

## 2012-11-04 MED ORDER — LOSARTAN POTASSIUM 100 MG PO TABS: 100.0000 mg | ORAL_TABLET | Freq: Every day | ORAL | Status: DC

## 2012-12-06 ENCOUNTER — Other Ambulatory Visit: Payer: Self-pay | Admitting: *Deleted

## 2012-12-06 MED ORDER — QUETIAPINE FUMARATE 100 MG PO TABS: 100.0000 mg | ORAL_TABLET | Freq: Two times a day (BID) | ORAL | Status: DC

## 2012-12-06 NOTE — Telephone Encounter (Signed)
Received faxed refill request from pharmacy. Last office visit 10/07/12. Is it okay to refill medication?

## 2012-12-15 ENCOUNTER — Encounter (HOSPITAL_COMMUNITY): Payer: Self-pay | Admitting: Emergency Medicine

## 2012-12-15 DIAGNOSIS — Z7982 Long term (current) use of aspirin: Secondary | ICD-10-CM | POA: Insufficient documentation

## 2012-12-15 DIAGNOSIS — G309 Alzheimer's disease, unspecified: Secondary | ICD-10-CM | POA: Insufficient documentation

## 2012-12-15 DIAGNOSIS — I10 Essential (primary) hypertension: Secondary | ICD-10-CM | POA: Insufficient documentation

## 2012-12-15 DIAGNOSIS — M129 Arthropathy, unspecified: Secondary | ICD-10-CM | POA: Insufficient documentation

## 2012-12-15 DIAGNOSIS — Z79899 Other long term (current) drug therapy: Secondary | ICD-10-CM | POA: Insufficient documentation

## 2012-12-15 DIAGNOSIS — E039 Hypothyroidism, unspecified: Secondary | ICD-10-CM | POA: Insufficient documentation

## 2012-12-15 DIAGNOSIS — S0990XA Unspecified injury of head, initial encounter: Secondary | ICD-10-CM | POA: Insufficient documentation

## 2012-12-15 DIAGNOSIS — F0281 Dementia in other diseases classified elsewhere with behavioral disturbance: Secondary | ICD-10-CM | POA: Insufficient documentation

## 2012-12-15 DIAGNOSIS — W010XXA Fall on same level from slipping, tripping and stumbling without subsequent striking against object, initial encounter: Secondary | ICD-10-CM | POA: Insufficient documentation

## 2012-12-15 DIAGNOSIS — S0180XA Unspecified open wound of other part of head, initial encounter: Secondary | ICD-10-CM | POA: Insufficient documentation

## 2012-12-15 DIAGNOSIS — F02818 Dementia in other diseases classified elsewhere, unspecified severity, with other behavioral disturbance: Secondary | ICD-10-CM | POA: Insufficient documentation

## 2012-12-15 DIAGNOSIS — Y9301 Activity, walking, marching and hiking: Secondary | ICD-10-CM | POA: Insufficient documentation

## 2012-12-15 DIAGNOSIS — Y92009 Unspecified place in unspecified non-institutional (private) residence as the place of occurrence of the external cause: Secondary | ICD-10-CM | POA: Insufficient documentation

## 2012-12-15 DIAGNOSIS — F0280 Dementia in other diseases classified elsewhere without behavioral disturbance: Secondary | ICD-10-CM | POA: Insufficient documentation

## 2012-12-15 NOTE — ED Notes (Signed)
PT. LOST BALANCED AND FELL AT HOME THIS EVENING , PRESENTS WITH LEFT LATERAL EYE LACERATION APPROX. 1/2 INCH -BLEEDING CONTROLLED , NO LOC , SPOUSE STATED PT. HAS ALZHIEMER'S DEMENTIA.

## 2012-12-16 ENCOUNTER — Emergency Department (HOSPITAL_COMMUNITY)
Admission: EM | Admit: 2012-12-16 | Discharge: 2012-12-16 | Disposition: A | Discharge status: Home / Self Care | Payer: Medicare Other | Attending: Emergency Medicine | Admitting: Emergency Medicine

## 2012-12-16 DIAGNOSIS — S0181XA Laceration without foreign body of other part of head, initial encounter: Secondary | ICD-10-CM

## 2012-12-16 DIAGNOSIS — S0990XA Unspecified injury of head, initial encounter: Secondary | ICD-10-CM

## 2012-12-16 DIAGNOSIS — W19XXXA Unspecified fall, initial encounter: Secondary | ICD-10-CM

## 2012-12-16 NOTE — ED Notes (Signed)
Pt is confused about situation, but per family is at baseline cognition level.  Pt does not remember falls, but family states the pt has been having falls from losing balance for 1-2 years with an increase in the number of falls over the past 1-2 months.  Family states pt has atleast 3-4 falls per week, with two falls tonight.

## 2012-12-16 NOTE — ED Provider Notes (Signed)
History    CSN: [id number] Arrival date & time 12/15/12  2330  First MD Initiated Contact with Patient 12/16/12 [phone]     Chief Complaint  Patient presents with  . Fall   (Consider location/radiation/quality/duration/timing/severity/associated sxs/prior Treatment) HPI A LEVEL 5 CAVEAT PERTAINS DUE TO DEMENTIA Pt with hx of dementia presents after a fall.  Wife states he fell while walking and hit the left side of his head/face on the corner of a small table.   Pt is at his baseline per family members and falls frequently.  No LOC, no vomiting, no seizure activity. Bleeding controlled prior to arrival.  No neck or back pain.   Past Medical History  Diagnosis Date  . Arthritis   . Alzheimer's dementia with behavioral disturbance   . Hypertension   . Hypothyroid    Past Surgical History  Procedure Laterality Date  . Reconstruction medial collateral ligament elbow w/ tendon graft     No family history on file. History  Substance Use Topics  . Smoking status: Never Smoker   . Smokeless tobacco: Never Used  . Alcohol Use: No    Review of Systems UNABLE TO OBTAIN ROS DUE TO LEVEL 5 CAVEAT  Allergies  Review of patient's allergies indicates no known allergies.  Home Medications   Current Outpatient Rx  Name  Route  Sig  Dispense  Refill  . aspirin 81 MG EC tablet   Oral   Take 81 mg by mouth daily.           . citalopram (CELEXA) 40 MG tablet   Oral   Take 1 tablet (40 mg total) by mouth daily.   30 tablet   3   . levothyroxine (SYNTHROID, LEVOTHROID) 88 MCG tablet   Oral   Take 1 tablet (88 mcg total) by mouth daily.   30 tablet   11   . losartan (COZAAR) 100 MG tablet   Oral   Take 1 tablet (100 mg total) by mouth daily.   30 tablet   6   . meloxicam (MOBIC) 15 MG tablet   Oral   Take 1 tablet (15 mg total) by mouth daily.   30 tablet   0   . QUEtiapine (SEROQUEL) 100 MG tablet   Oral   Take 1 tablet (100 mg total) by mouth 2 (two) times daily.    60 tablet   5   . Tamsulosin HCl (FLOMAX) 0.4 MG CAPS   Oral   Take 1 capsule (0.4 mg total) by mouth daily.   30 capsule   6   . vitamin B-12 (CYANOCOBALAMIN) 1000 MCG tablet   Oral   Take 1,000 mcg by mouth daily.           . vitamin D, CHOLECALCIFEROL, 400 UNITS tablet   Oral   Take 400 Units by mouth daily.            BP 138/92  Pulse 70  Temp(Src) 98.8 F (37.1 C) (Oral)  Resp 18  SpO2 97% Vitals reviewed Physical Exam Physical Examination: General appearance - alert, well appearing, and in no distress Mental status - alert, oriented to person, place, not to time Head- NCAT except bruising of left zygomatic arch and inverted V shaped laceration to left face- lateral to eye Eyes - pupils equal and reactive, extraocular eye movements intact Nose - normal and patent, no erythema, discharge or polyps Mouth - mucous membranes moist, pharynx normal without lesions Neck - no midline tenderness  to palpation, FROM without pain Chest - clear to auscultation, no wheezes, rales or rhonchi, symmetric air entry Heart - normal rate, regular rhythm, normal S1, S2, no murmurs, rubs, clicks or gallops Abdomen - soft, nontender, nondistended, no masses or organomegaly Back exam - no midline c/t/l spine tenderness to palpation, no CVA tenderness Neurological - alert, oriented x 2 and at his baseline per family, cranial nerves grossly intact, strength 5/5 in extremities x 4, sensation intact Extremities - peripheral pulses normal, no pedal edema, no clubbing or cyanosis Skin - normal coloration and turgor, no rashes, inverted V shaped laceration to lateral area of left eye, skin at point of V shape abraded- not able to fully close wound due to this  ED Course  LACERATION REPAIR Date/Time: 12/16/2012 4:52 AM Performed by: Ethelda Chick Authorized by: Ethelda Chick Consent: Verbal consent obtained. written consent not obtained. Risks and benefits: risks, benefits and  alternatives were discussed Consent given by: patient and spouse Patient understanding: patient states understanding of the procedure being performed Required items: required blood products, implants, devices, and special equipment available Patient identity confirmed: verbally with patient and arm band Time out: Immediately prior to procedure a "time out" was called to verify the correct patient, procedure, equipment, support staff and site/side marked as required. Body area: head/neck Location details: left cheek Laceration length: 2 cm Foreign bodies: no foreign bodies Tendon involvement: none Nerve involvement: none Vascular damage: no Anesthesia: local infiltration Local anesthetic: lidocaine 1% with epinephrine Anesthetic total: 1 ml Patient sedated: no Preparation: Patient was prepped and draped in the usual sterile fashion. Irrigation solution: saline Irrigation method: syringe Amount of cleaning: standard Debridement: none Degree of undermining: none Skin closure: 6-0 Prolene Number of sutures: 3 Technique: simple Approximation: close Approximation difficulty: simple Dressing: antibiotic ointment and 4x4 sterile gauze Comments: V shaped laceration, with top of V having abraded skin- not able to fully approximate, but improved appearance after sutures placed   (including critical care time) Labs Reviewed - No data to display No results found. 1. Fall, initial encounter   2. Minor head injury, initial encounter   3. Laceration of face, initial encounter     MDM  Pt with hx of dementia presents after mechanical fall at home.  Pt is at his baseline, no LOC or other signs of significant head injury, does not take blood thinners.  Laceration repaired, no evidence of facial fractures- EOM full without pain, no maloclusion.  No neck of back tenderness or evidence of other acute injuries on exam.  Discharged with strict return precautions.  Family agreeable with  plan.  Ethelda Chick, MD 12/16/12 [phone]

## 2012-12-20 ENCOUNTER — Ambulatory Visit: Payer: Medicare Other | Admitting: Family Medicine

## 2012-12-21 ENCOUNTER — Ambulatory Visit: Payer: Medicare Other | Admitting: Family Medicine

## 2012-12-21 ENCOUNTER — Ambulatory Visit (INDEPENDENT_AMBULATORY_CARE_PROVIDER_SITE_OTHER): Payer: Medicare Other | Admitting: Family Medicine

## 2012-12-21 ENCOUNTER — Encounter: Payer: Self-pay | Admitting: Family Medicine

## 2012-12-21 VITALS — BP 120/70 | HR 78 | Temp 98.2°F | Ht 67.0 in | Wt 163.5 lb

## 2012-12-21 DIAGNOSIS — Z5189 Encounter for other specified aftercare: Secondary | ICD-10-CM

## 2012-12-21 DIAGNOSIS — S0181XD Laceration without foreign body of other part of head, subsequent encounter: Secondary | ICD-10-CM

## 2012-12-21 NOTE — Progress Notes (Signed)
  Subjective:    Patient ID: Dylan Roach, male    DOB: [birth year], 79 y.o.   MRN: [id number]  HPI 79 year old male with alzheimer's dementia presents following fall from loss of balance  6 days ago.  Fell on left side and hit left face on table. No LOC. No associated SOB, no CP. He falls very frequently. Went [place]. 3 sutures placed, no further eval done.  Has follow up with Dr. Patsy Lager in 01/2013  Review of Systems  Constitutional: Negative for fever, chills and fatigue.  HENT: Negative for ear pain.   Eyes: Negative for pain.  Respiratory: Negative for shortness of breath.   Cardiovascular: Negative for chest pain.       Objective:   Physical Exam  Constitutional: He appears well-developed.  Eyes: Pupils are equal, round, and reactive to light.  Cardiovascular: Normal rate and regular rhythm.   Skin:  Well healing laceration lateral to left eye.          Assessment & Plan:  3 sutures removed without complication. Laceration well healing. Edges well approximated. Minimal surrounding erythema.  one steristrip placed.

## 2012-12-21 NOTE — Patient Instructions (Addendum)
Wash area with warm soapy water.  Allow steri strip to fall off.

## 2012-12-22 DIAGNOSIS — S0181XA Laceration without foreign body of other part of head, initial encounter: Secondary | ICD-10-CM | POA: Insufficient documentation

## 2013-01-05 ENCOUNTER — Ambulatory Visit (INDEPENDENT_AMBULATORY_CARE_PROVIDER_SITE_OTHER): Payer: Medicare Other | Admitting: Family Medicine

## 2013-01-05 ENCOUNTER — Encounter: Payer: Self-pay | Admitting: Family Medicine

## 2013-01-05 VITALS — BP 140/82 | HR 87 | Temp 97.7°F | Ht 67.0 in | Wt 162.5 lb

## 2013-01-05 DIAGNOSIS — R079 Chest pain, unspecified: Secondary | ICD-10-CM

## 2013-01-05 DIAGNOSIS — R0781 Pleurodynia: Secondary | ICD-10-CM

## 2013-01-05 NOTE — Progress Notes (Signed)
Nature conservation officer at [facility] [address] [place] [address] Phone: [phone] Fax: [phone]  Date:  01/05/2013   Name:  Dylan Roach   DOB:  04/22/1924   MRN:  [id number] Gender: male Age: 79 y.o.  Primary Physician:  Hannah Beat, MD  Evaluating MD: Hannah Beat, MD   Chief Complaint: Fall   History of Present Illness:  Dylan Roach is a 79 y.o. pleasant patient who presents with the following:  Fallen again - night before last, got up to go to the bathroom. Sitting on the floor. On the left side of chest and really painful. Bruise on arm.  Recent fall and facial lac. Irritability. Now primarily, the patient and his wife are principally complaining about left-sided pain. He fell the night before last, and now he is been complaining of the left side of his chest.  Patient Active Problem List   Diagnosis Date Noted  . Laceration of face 12/22/2012  . Weight loss, non-intentional 01/01/2011  . Fatigue 01/01/2011  . Alzheimer's dementia 12/02/2010  . HYPOTHYROIDISM 10/01/2009  . ESSENTIAL HYPERTENSION 10/01/2009  . ARTHRITIS 10/01/2009  . CHICKENPOX, HX OF 10/01/2009    Past Medical History  Diagnosis Date  . Arthritis   . Alzheimer's dementia with behavioral disturbance   . Hypertension   . Hypothyroid     Past Surgical History  Procedure Laterality Date  . Reconstruction medial collateral ligament elbow w/ tendon graft      History   Social History  . Marital Status: Married    Spouse Name: N/A    Number of Children: N/A  . Years of Education: N/A   Occupational History  . retired    Social History Main Topics  . Smoking status: Never Smoker   . Smokeless tobacco: Never Used  . Alcohol Use: No  . Drug Use: No  . Sexually Active: Not on file   Other Topics Concern  . Not on file   Social History Narrative  . No narrative on file    No family history on file.  No Known Allergies  Medication list has been  reviewed and updated.  Outpatient Prescriptions Prior to Visit  Medication Sig Dispense Refill  . aspirin 81 MG EC tablet Take 81 mg by mouth daily.        . citalopram (CELEXA) 40 MG tablet Take 1 tablet (40 mg total) by mouth daily.  30 tablet  3  . levothyroxine (SYNTHROID, LEVOTHROID) 88 MCG tablet Take 1 tablet (88 mcg total) by mouth daily.  30 tablet  11  . losartan (COZAAR) 100 MG tablet Take 1 tablet (100 mg total) by mouth daily.  30 tablet  6  . meloxicam (MOBIC) 15 MG tablet Take 1 tablet (15 mg total) by mouth daily.  30 tablet  0  . QUEtiapine (SEROQUEL) 100 MG tablet Take 1 tablet (100 mg total) by mouth 2 (two) times daily.  60 tablet  5  . Tamsulosin HCl (FLOMAX) 0.4 MG CAPS Take 1 capsule (0.4 mg total) by mouth daily.  30 capsule  6  . vitamin B-12 (CYANOCOBALAMIN) 1000 MCG tablet Take 1,000 mcg by mouth daily.        . vitamin D, CHOLECALCIFEROL, 400 UNITS tablet Take 400 Units by mouth daily.         No facility-administered medications prior to visit.    Review of Systems:   GEN: No fevers, chills. Nontoxic. Primarily MSK c/o today. MSK: Detailed in  the HPI GI: tolerating PO intake without difficulty Neuro: No numbness, parasthesias, or tingling associated. Otherwise the pertinent positives of the ROS are noted above.    Physical Examination: BP 140/82  Pulse 87  Temp(Src) 97.7 F (36.5 C) (Oral)  Ht 5\' 7"  (1.702 m)  Wt 162 lb 8 oz (73.71 kg)  BMI 25.45 kg/m2  SpO2 96%  Ideal Body Weight: Weight in (lb) to have BMI = 25: 159.3   GEN: WDWN, NAD, Non-toxic HEENT: Atraumatic, Normocephalic.  Ears and Nose: No external deformity. EXTR: No clubbing/cyanosis/edema NEURO: Normal gait.  PSYCH: Normally interactive. Dementia.  Mild to moderate tenderness tenderness on the left side of the patient's chest wall. I do not appreciate any bruising. Nontender along the humerus and clavicle bilaterally. Nontender to start the Assessment and Plan:  Rib pain on  left side  Rib belt, Tylenol, and Aleve when necessary.  Orders Today:  No orders of the defined types were placed in this encounter.    Updated Medication List: (Includes new medications, updates to list, dose adjustments) No orders of the defined types were placed in this encounter.    Medications Discontinued: There are no discontinued medications.    Signed, Elpidio Galea. Cornell Gaber, MD 01/05/2013 10:27 AM

## 2013-01-08 ENCOUNTER — Other Ambulatory Visit: Payer: Self-pay | Admitting: Family Medicine

## 2013-01-16 ENCOUNTER — Emergency Department (HOSPITAL_COMMUNITY): Payer: Medicare Other

## 2013-01-16 ENCOUNTER — Emergency Department (HOSPITAL_COMMUNITY)
Admission: EM | Admit: 2013-01-16 | Discharge: 2013-01-16 | Disposition: A | Discharge status: Home / Self Care | Payer: Medicare Other | Attending: Emergency Medicine | Admitting: Emergency Medicine

## 2013-01-16 ENCOUNTER — Encounter (HOSPITAL_COMMUNITY): Payer: Self-pay | Admitting: Nurse Practitioner

## 2013-01-16 DIAGNOSIS — Y9229 Other specified public building as the place of occurrence of the external cause: Secondary | ICD-10-CM | POA: Insufficient documentation

## 2013-01-16 DIAGNOSIS — S0180XA Unspecified open wound of other part of head, initial encounter: Secondary | ICD-10-CM | POA: Insufficient documentation

## 2013-01-16 DIAGNOSIS — Z79899 Other long term (current) drug therapy: Secondary | ICD-10-CM | POA: Insufficient documentation

## 2013-01-16 DIAGNOSIS — S99929A Unspecified injury of unspecified foot, initial encounter: Secondary | ICD-10-CM | POA: Insufficient documentation

## 2013-01-16 DIAGNOSIS — Z792 Long term (current) use of antibiotics: Secondary | ICD-10-CM | POA: Insufficient documentation

## 2013-01-16 DIAGNOSIS — M129 Arthropathy, unspecified: Secondary | ICD-10-CM | POA: Insufficient documentation

## 2013-01-16 DIAGNOSIS — S0181XA Laceration without foreign body of other part of head, initial encounter: Secondary | ICD-10-CM

## 2013-01-16 DIAGNOSIS — F0280 Dementia in other diseases classified elsewhere without behavioral disturbance: Secondary | ICD-10-CM | POA: Insufficient documentation

## 2013-01-16 DIAGNOSIS — F0390 Unspecified dementia without behavioral disturbance: Secondary | ICD-10-CM | POA: Insufficient documentation

## 2013-01-16 DIAGNOSIS — W1809XA Striking against other object with subsequent fall, initial encounter: Secondary | ICD-10-CM | POA: Insufficient documentation

## 2013-01-16 DIAGNOSIS — F02818 Dementia in other diseases classified elsewhere, unspecified severity, with other behavioral disturbance: Secondary | ICD-10-CM | POA: Insufficient documentation

## 2013-01-16 DIAGNOSIS — G309 Alzheimer's disease, unspecified: Secondary | ICD-10-CM | POA: Insufficient documentation

## 2013-01-16 DIAGNOSIS — S8990XA Unspecified injury of unspecified lower leg, initial encounter: Secondary | ICD-10-CM | POA: Insufficient documentation

## 2013-01-16 DIAGNOSIS — Z7982 Long term (current) use of aspirin: Secondary | ICD-10-CM | POA: Insufficient documentation

## 2013-01-16 DIAGNOSIS — I10 Essential (primary) hypertension: Secondary | ICD-10-CM | POA: Insufficient documentation

## 2013-01-16 DIAGNOSIS — Y9301 Activity, walking, marching and hiking: Secondary | ICD-10-CM | POA: Insufficient documentation

## 2013-01-16 DIAGNOSIS — F0281 Dementia in other diseases classified elsewhere with behavioral disturbance: Secondary | ICD-10-CM | POA: Insufficient documentation

## 2013-01-16 DIAGNOSIS — M25562 Pain in left knee: Secondary | ICD-10-CM

## 2013-01-16 DIAGNOSIS — S0990XA Unspecified injury of head, initial encounter: Secondary | ICD-10-CM

## 2013-01-16 DIAGNOSIS — E039 Hypothyroidism, unspecified: Secondary | ICD-10-CM | POA: Insufficient documentation

## 2013-01-16 LAB — POCT I-STAT, CHEM 8
Calcium, Ion: 1.12 mmol/L — ABNORMAL LOW (ref 1.13–1.30)
Creatinine, Ser: 1.5 mg/dL — ABNORMAL HIGH (ref 0.50–1.35)
Glucose, Bld: 96 mg/dL (ref 70–99)
HCT: 44 % (ref 39.0–52.0)
Hemoglobin: 15 g/dL (ref 13.0–17.0)
TCO2: 24 mmol/L (ref 0–100)

## 2013-01-16 NOTE — ED Provider Notes (Addendum)
CSN: [id number]     Arrival date & time 01/16/13  1252 History     First MD Initiated Contact with Patient 01/16/13 1406     Chief Complaint  Patient presents with  . Fall    The history is provided by the spouse and a relative. History limited by: Level V caveat: Dementia.   as reported that the patient was walking at church today with his walker when he lost his balance and fell forward striking his forehead.  This resulted in a laceration.  No loss consciousness.  No confusion since the event.  His only anticoagulant is aspirin.  He denies significant neck pain.  He denies weakness of his arms or legs.  No chest pain shortness of breath.  He reports that he hurts in his left knee.  Family reports abrasions of his bilateral knees.  He has a history of dementia, hypertension.  He's been compliant with his medications.  He's otherwise been eating and drinking normally.  He's been in his normal state of health over the past several days.  Past Medical History  Diagnosis Date  . Arthritis   . Alzheimer's dementia with behavioral disturbance   . Hypertension   . Hypothyroid    Past Surgical History  Procedure Laterality Date  . Reconstruction medial collateral ligament elbow w/ tendon graft     History reviewed. No pertinent family history. History  Substance Use Topics  . Smoking status: Never Smoker   . Smokeless tobacco: Never Used  . Alcohol Use: No    Review of Systems  Unable to perform ROS: Dementia    Allergies  Review of patient's allergies indicates no known allergies.  Home Medications   Current Outpatient Rx  Name  Route  Sig  Dispense  Refill  . amoxicillin (AMOXIL) 500 MG capsule   Oral   Take 500 mg by mouth 3 (three) times daily.         Marland Kitchen aspirin 81 MG EC tablet   Oral   Take 81 mg by mouth daily.           . citalopram (CELEXA) 40 MG tablet   Oral   Take 40 mg by mouth every evening.         Marland Kitchen levothyroxine (SYNTHROID, LEVOTHROID) 88 MCG  tablet   Oral   Take 88 mcg by mouth daily before breakfast.         . losartan (COZAAR) 100 MG tablet   Oral   Take 100 mg by mouth daily.         . QUEtiapine (SEROQUEL) 100 MG tablet   Oral   Take 100 mg by mouth 2 (two) times daily.         . tamsulosin (FLOMAX) 0.4 MG CAPS   Oral   Take 0.4 mg by mouth daily after supper.         . vitamin B-12 (CYANOCOBALAMIN) 1000 MCG tablet   Oral   Take 1,000 mcg by mouth daily.           . vitamin D, CHOLECALCIFEROL, 400 UNITS tablet   Oral   Take 400 Units by mouth daily.            BP 123/62  Pulse 72  Temp(Src) 97.8 F (36.6 C) (Oral)  Resp 16  SpO2 99% Physical Exam  Nursing note and vitals reviewed. Constitutional: He is oriented to person, place, and time. He appears well-developed and well-nourished.  HENT:  Head: Normocephalic and  atraumatic.  Laceration to his right lateral mid forehead without active bleeding.  Extraocular movements are intact.  No tenderness of his bilateral periorbital rims.  No trismus or malocclusion.  No dental injury.  No tenderness over his bilateral TMJs  Eyes: EOM are normal.  Neck: Neck supple.  Mild cervical and paracervical tenderness without step-off  Cardiovascular: Normal rate, regular rhythm, normal heart sounds and intact distal pulses.   Pulmonary/Chest: Effort normal and breath sounds normal. No respiratory distress.  Abdominal: Soft. He exhibits no distension. There is no tenderness.  Genitourinary: Rectum normal.  Musculoskeletal: Normal range of motion.  No thoracic or lumbar tenderness.  Full range of motion bilateral knees and hips.  Mild tenderness on his left lateral joint line.  Abrasions of his bilateral knees.  Normal pulses distal  Neurological: He is alert and oriented to person, place, and time.  Skin: Skin is warm and dry.  Psychiatric: He has a normal mood and affect. Judgment normal.    ED Course   Procedures (including critical care  time)  LACERATION REPAIR Performed by: Lyanne Co Consent: Verbal consent obtained. Risks and benefits: risks, benefits and alternatives were discussed Patient identity confirmed: provided demographic data Time out performed prior to procedure Prepped and Draped in normal sterile fashion Wound explored Laceration Location: Right lateral forehead Laceration Length: 3 cm No Foreign Bodies seen or palpated Anesthesia: local infiltration Local anesthetic: lidocaine 2 % with epinephrine Anesthetic total: 5 ml Irrigation method: syringe Amount of cleaning: standard Skin closure: 4-0 Prolene  Number of sutures or staples: 6  Technique: Running interlocked  Patient tolerance: Patient tolerated the procedure well with no immediate complications.   Labs Reviewed  POCT I-STAT, CHEM 8 - Abnormal; Notable for the following:    Creatinine, Ser 1.50 (*)    Calcium, Ion 1.12 (*)    All other components within normal limits   Ct Cervical Spine Wo Contrast  01/16/2013   *RADIOLOGY REPORT*  Clinical Data:  Pain post trauma  CT HEAD WITHOUT CONTRAST CT CERVICAL SPINE WITHOUT CONTRAST  Technique:  Multidetector CT imaging of the head and cervical spine was performed following the standard protocol without intravenous contrast.  Multiplanar CT image reconstructions of the cervical spine were also generated.  Comparison:  August 30, 2011  CT HEAD  Findings:  There is moderate diffuse atrophy.  There is no mass, hemorrhage, extra-axial fluid collection, or midline shift.  There is small vessel disease throughout the centra semiovale bilaterally, stable.  There is no new gray-white compartment lesions.  There is a right frontal scalp hematoma.  Bony calvarium appears intact.  The mastoid air cells are clear.  IMPRESSION: Atrophy with small vessel disease.  No intracranial hemorrhage or mass.  There is a right frontal scalp hematoma.  Bony calvarium appears intact.  CT CERVICAL SPINE  Findings: There is no  fracture or spondylolisthesis.  Prevertebral soft tissues and predental space regions are normal.  There is moderately severe disc narrowing at C3-4.  There is marked disc narrowing at C5-6, C6-7, and C7-T1.  There is exit foraminal narrowing at C3-4 bilaterally, C4-5 on the left, C5-6 on the right, and C6-7 bilaterally due to severe facet hypertrophy.  No frank disc extrusion or stenosis.  There is calcification in both carotid and vertebral arteries.  IMPRESSION: Multilevel spondylosis and osteoarthritic change.  No fracture or spondylolisthesis.  Multifocal atherosclerotic change.   Original Report Authenticated By: Bretta Bang, M.D.   Dg Knee Complete 4 Views Left  01/16/2013   *RADIOLOGY REPORT*  Clinical Data: History of fall complaining of left knee pain.  LEFT KNEE - COMPLETE 4+ VIEW  Comparison: No priors.  Findings: Four views of the left knee demonstrate no acute displaced fracture, subluxation, dislocation, joint or soft tissue abnormality.  There is advanced joint space narrowing, subchondral sclerosis, subchondral cyst formation and osteophyte formation, most severe in the medial and patellofemoral compartments, compatible with osteoarthritis.  Numerous vascular calcifications are noted.  IMPRESSION: 1.  No acute radiographic abnormality of the left knee. 2.  Advanced osteoarthritis, most severe in the medial and patellofemoral compartments. 3.  Atherosclerosis.   Original Report Authenticated By: Trudie Reed, M.D.   I personally reviewed the imaging tests through PACS system I reviewed available ER/hospitalization records through the EMR   1. Laceration of forehead without complication, initial encounter   2. Minor head injury, initial encounter   3. Left knee pain     MDM  4:25 PM Patient continues to feel good at this time.  CT head and C-spine are without acute abnormality.  Facial laceration repaired.  Repeat abdominal exam is benign.  Plain films of left knee are normal.   Discharge home in good condition.  Laceration repaired.  Sutures to be removed in 7 days.  Head injury and infection warnings given.  Lyanne Co, MD 01/16/13 1627  Lyanne Co, MD 01/16/13 [phone]

## 2013-01-16 NOTE — ED Notes (Signed)
Pt tripped in church parking [place] just p[place]. Scraped his knees, landed on his R shoulder and hit his R forehead. Pt denies LOC. Applied pressure and ice to forehead. Pt is alert and oriented. C/o head pain at site of injury.

## 2013-01-24 ENCOUNTER — Encounter: Payer: Self-pay | Admitting: Family Medicine

## 2013-01-24 ENCOUNTER — Ambulatory Visit (INDEPENDENT_AMBULATORY_CARE_PROVIDER_SITE_OTHER): Payer: Medicare Other | Admitting: Family Medicine

## 2013-01-24 VITALS — BP 110/68 | HR 80 | Temp 98.2°F | Ht 67.0 in | Wt 162.0 lb

## 2013-01-24 DIAGNOSIS — Z4802 Encounter for removal of sutures: Secondary | ICD-10-CM

## 2013-01-24 NOTE — Progress Notes (Signed)
Nature conservation officer at [facility] [address] [place] [address] Phone: [phone] Fax: [phone]  Date:  01/24/2013   Name:  Dylan Roach   DOB:  1923/07/01   MRN:  [id number] Gender: male Age: 79 y.o.  Primary Physician:  Hannah Beat, MD  Evaluating MD: Hannah Beat, MD   Chief Complaint: Dylan Roach follow-up   History of Present Illness:  Dylan Roach is a 79 y.o. pleasant patient who presents with the following:  Fall, stitch removal:  R above eye, # 6 stitches. From ER visit 01/16/2013. Now he is doing relatively well, we will release his baseline. His memory is still poor. He has had some increased falling, which we discussed in detail his last office note.  Patient Active Problem List   Diagnosis Date Noted  . Laceration of face 12/22/2012  . Weight loss, non-intentional 01/01/2011  . Fatigue 01/01/2011  . Alzheimer's dementia 12/02/2010  . HYPOTHYROIDISM 10/01/2009  . ESSENTIAL HYPERTENSION 10/01/2009  . ARTHRITIS 10/01/2009  . CHICKENPOX, HX OF 10/01/2009    Past Medical History  Diagnosis Date  . Arthritis   . Alzheimer's dementia with behavioral disturbance   . Hypertension   . Hypothyroid     Past Surgical History  Procedure Laterality Date  . Reconstruction medial collateral ligament elbow w/ tendon graft      History   Social History  . Marital Status: Married    Spouse Name: N/A    Number of Children: N/A  . Years of Education: N/A   Occupational History  . retired    Social History Main Topics  . Smoking status: Never Smoker   . Smokeless tobacco: Never Used  . Alcohol Use: No  . Drug Use: No  . Sexually Active: Not on file   Other Topics Concern  . Not on file   Social History Narrative  . No narrative on file    No family history on file.  No Known Allergies  Medication list has been reviewed and updated.  Outpatient Prescriptions Prior to Visit  Medication Sig Dispense Refill  . aspirin 81  MG EC tablet Take 81 mg by mouth daily.        . citalopram (CELEXA) 40 MG tablet Take 40 mg by mouth every evening.      Marland Kitchen levothyroxine (SYNTHROID, LEVOTHROID) 88 MCG tablet Take 88 mcg by mouth daily before breakfast.      . losartan (COZAAR) 100 MG tablet Take 100 mg by mouth daily.      . QUEtiapine (SEROQUEL) 100 MG tablet Take 100 mg by mouth 2 (two) times daily.      . tamsulosin (FLOMAX) 0.4 MG CAPS Take 0.4 mg by mouth daily after supper.      . vitamin B-12 (CYANOCOBALAMIN) 1000 MCG tablet Take 1,000 mcg by mouth daily.        . vitamin D, CHOLECALCIFEROL, 400 UNITS tablet Take 400 Units by mouth daily.        Marland Kitchen amoxicillin (AMOXIL) 500 MG capsule Take 500 mg by mouth 3 (three) times daily.       No facility-administered medications prior to visit.    Review of Systems:  As above, falls, no cp, some bruising  Physical Examination: BP 110/68  Pulse 80  Temp(Src) 98.2 F (36.8 C) (Oral)  Ht 5\' 7"  (1.702 m)  Wt 162 lb (73.483 kg)  BMI 25.37 kg/m2  Ideal Body Weight: Weight in (lb) to have BMI = 25: 159.3  GEN: WDWN, NAD, Non-toxic HEENT: Atraumatic, Normocephalic.  Healing wound on R forehead, #6 sutures. Ears and Nose: No external deformity. EXTR: No clubbing/cyanosis/edema NEURO: Normal gait.  PSYCH: Normally interactive.  Assessment and Plan: Encounter for removal of sutures   # 6 sutures removed without difficulty  Signed, Shiree Altemus T. Nakia Remmers, MD 01/24/2013 12:17 PM

## 2013-02-07 ENCOUNTER — Other Ambulatory Visit: Payer: Self-pay | Admitting: Family Medicine

## 2013-03-01 ENCOUNTER — Other Ambulatory Visit: Payer: Self-pay | Admitting: Family Medicine

## 2013-03-01 NOTE — Telephone Encounter (Signed)
Ref

## 2013-03-01 NOTE — Telephone Encounter (Signed)
Last physical 09/29/2011.  Do you want to refill?

## 2013-03-04 ENCOUNTER — Encounter: Payer: Self-pay | Admitting: Family Medicine

## 2013-03-04 ENCOUNTER — Ambulatory Visit (INDEPENDENT_AMBULATORY_CARE_PROVIDER_SITE_OTHER): Payer: Medicare Other | Admitting: Family Medicine

## 2013-03-04 ENCOUNTER — Ambulatory Visit (INDEPENDENT_AMBULATORY_CARE_PROVIDER_SITE_OTHER)
Admission: RE | Admit: 2013-03-04 | Discharge: 2013-03-04 | Disposition: A | Discharge status: Home / Self Care | Payer: Medicare Other | Source: Ambulatory Visit | Attending: Family Medicine | Admitting: Family Medicine

## 2013-03-04 VITALS — BP 120/68 | HR 77 | Temp 98.1°F | Ht 67.0 in | Wt 160.8 lb

## 2013-03-04 DIAGNOSIS — M79609 Pain in unspecified limb: Secondary | ICD-10-CM

## 2013-03-04 DIAGNOSIS — M79642 Pain in left hand: Secondary | ICD-10-CM

## 2013-03-04 DIAGNOSIS — S62309A Unspecified fracture of unspecified metacarpal bone, initial encounter for closed fracture: Secondary | ICD-10-CM

## 2013-03-04 DIAGNOSIS — W19XXXA Unspecified fall, initial encounter: Secondary | ICD-10-CM

## 2013-03-04 NOTE — Progress Notes (Signed)
  Subjective:    Patient ID: Dylan Roach, male    DOB: 03-17-1924, 79 y.o.   MRN: [id number]  HPI 79 year old male pt of Dr. Cyndie Chime with history of alzheimer's dementia, frequent falls presents following fall yesterday, fell backwards into fireplace. He lost balance and landed on left hand and back.  No proceeding symptoms. Wife states he has been well.   No headache, no heart racing, no SOB, no chest pain. Left hand began swelling alter in the day, pain mild to moderate, increased with movement.  When he feel he did not hit head per pt... He was behind wife so she did not exactly witness the fall. No change in mental status.  On baby aspirin.  He has not needed any pain med for it. They iced it initially for a few minutes.    Review of Systems  Constitutional: Negative for fever and fatigue.  HENT: Negative for ear pain.   Eyes: Negative for pain.  Respiratory: Negative for cough, shortness of breath and wheezing.   Cardiovascular: Negative for chest pain, palpitations and leg swelling.  Gastrointestinal: Negative for abdominal pain.  Neurological: Negative for dizziness, weakness, light-headedness and headaches.  Psychiatric/Behavioral: Positive for confusion.       Objective:   Physical Exam  Constitutional: Vital signs are normal. He appears well-developed and well-nourished.  Elderly male in NAD  HENT:  Head: Normocephalic.  Right Ear: Hearing normal.  Left Ear: Hearing normal.  Nose: Nose normal.  Mouth/Throat: Oropharynx is clear and moist and mucous membranes are normal.  Neck: Trachea normal. Carotid bruit is not present. No mass and no thyromegaly present.  Cardiovascular: Normal rate, regular rhythm and normal pulses.  Exam reveals no gallop, no distant heart sounds and no friction rub.   No murmur heard. No peripheral edema  Pulmonary/Chest: Effort normal and breath sounds normal. No respiratory distress.  Musculoskeletal:  Left hand diffusely swollen  and red, contusion... Most ttp over 3rd proximal phanges and 2, 3,4th metacarpals.  Skin: Skin is warm, dry and intact. No rash noted.  Psychiatric: He has a normal mood and affect. His speech is normal and behavior is normal. Thought content normal.          Assessment & Plan:

## 2013-03-04 NOTE — Patient Instructions (Addendum)
Stop at front desk for referral to [facility].

## 2013-03-04 NOTE — Assessment & Plan Note (Signed)
Concerning for fracture. Send for X-rays given exam and age.   X-ray returned... Fracture of 3rd and 4th metacarpals displaced.

## 2013-03-31 ENCOUNTER — Other Ambulatory Visit: Payer: Self-pay | Admitting: Family Medicine

## 2013-03-31 NOTE — Telephone Encounter (Signed)
Last TSH 09/29/2011.  Ok to refill?

## 2013-04-07 ENCOUNTER — Other Ambulatory Visit: Payer: Self-pay | Admitting: Family Medicine

## 2013-06-09 ENCOUNTER — Other Ambulatory Visit: Payer: Self-pay | Admitting: Family Medicine

## 2013-06-09 NOTE — Telephone Encounter (Signed)
Last office visit 03/04/2013 with Dr. Ermalene Searing.  Ok to refill?

## 2013-06-14 ENCOUNTER — Telehealth: Payer: Self-pay | Admitting: Family Medicine

## 2013-06-14 ENCOUNTER — Ambulatory Visit (INDEPENDENT_AMBULATORY_CARE_PROVIDER_SITE_OTHER): Payer: Medicare Other

## 2013-06-14 DIAGNOSIS — Z23 Encounter for immunization: Secondary | ICD-10-CM

## 2013-06-14 NOTE — Telephone Encounter (Signed)
Pt's wife requesting letter excusing pt from jury duty r/t alzheimer's disease.  Please call her when ready to be picked up.

## 2013-06-15 DIAGNOSIS — Z23 Encounter for immunization: Secondary | ICD-10-CM

## 2013-06-20 NOTE — Telephone Encounter (Signed)
Left message for Alona Bene that letter is ready to be picked up at front desk.

## 2013-06-20 NOTE — Telephone Encounter (Signed)
Done.  Letter under Mylinda Latina. [facility]'s letter section.  Would you mind printing and stamping for her?  Thanks!

## 2013-06-23 ENCOUNTER — Inpatient Hospital Stay (HOSPITAL_COMMUNITY)
Admission: EM | Admit: 2013-06-23 | Discharge: 2013-06-29 | DRG: 871 | Disposition: A | Discharge status: Hospice | Payer: Medicare Other | Attending: Internal Medicine | Admitting: Internal Medicine

## 2013-06-23 ENCOUNTER — Emergency Department (HOSPITAL_COMMUNITY): Payer: Medicare Other

## 2013-06-23 ENCOUNTER — Encounter (HOSPITAL_COMMUNITY): Payer: Self-pay | Admitting: Emergency Medicine

## 2013-06-23 DIAGNOSIS — E039 Hypothyroidism, unspecified: Secondary | ICD-10-CM | POA: Diagnosis present

## 2013-06-23 DIAGNOSIS — R339 Retention of urine, unspecified: Secondary | ICD-10-CM | POA: Diagnosis present

## 2013-06-23 DIAGNOSIS — J15211 Pneumonia due to Methicillin susceptible Staphylococcus aureus: Secondary | ICD-10-CM | POA: Diagnosis present

## 2013-06-23 DIAGNOSIS — Z7982 Long term (current) use of aspirin: Secondary | ICD-10-CM

## 2013-06-23 DIAGNOSIS — B9561 Methicillin susceptible Staphylococcus aureus infection as the cause of diseases classified elsewhere: Secondary | ICD-10-CM

## 2013-06-23 DIAGNOSIS — N19 Unspecified kidney failure: Secondary | ICD-10-CM

## 2013-06-23 DIAGNOSIS — G309 Alzheimer's disease, unspecified: Secondary | ICD-10-CM | POA: Diagnosis present

## 2013-06-23 DIAGNOSIS — A419 Sepsis, unspecified organism: Secondary | ICD-10-CM | POA: Diagnosis present

## 2013-06-23 DIAGNOSIS — F0281 Dementia in other diseases classified elsewhere with behavioral disturbance: Secondary | ICD-10-CM | POA: Diagnosis present

## 2013-06-23 DIAGNOSIS — E872 Acidosis, unspecified: Secondary | ICD-10-CM | POA: Diagnosis present

## 2013-06-23 DIAGNOSIS — R0902 Hypoxemia: Secondary | ICD-10-CM

## 2013-06-23 DIAGNOSIS — R7881 Bacteremia: Secondary | ICD-10-CM | POA: Diagnosis present

## 2013-06-23 DIAGNOSIS — Z66 Do not resuscitate: Secondary | ICD-10-CM | POA: Diagnosis present

## 2013-06-23 DIAGNOSIS — J1100 Influenza due to unidentified influenza virus with unspecified type of pneumonia: Secondary | ICD-10-CM | POA: Diagnosis present

## 2013-06-23 DIAGNOSIS — E162 Hypoglycemia, unspecified: Secondary | ICD-10-CM | POA: Diagnosis present

## 2013-06-23 DIAGNOSIS — A4101 Sepsis due to Methicillin susceptible Staphylococcus aureus: Principal | ICD-10-CM | POA: Diagnosis present

## 2013-06-23 DIAGNOSIS — Z515 Encounter for palliative care: Secondary | ICD-10-CM

## 2013-06-23 DIAGNOSIS — R000 Tachycardia, unspecified: Secondary | ICD-10-CM | POA: Diagnosis present

## 2013-06-23 DIAGNOSIS — E860 Dehydration: Secondary | ICD-10-CM

## 2013-06-23 DIAGNOSIS — R05 Cough: Secondary | ICD-10-CM

## 2013-06-23 DIAGNOSIS — J189 Pneumonia, unspecified organism: Secondary | ICD-10-CM | POA: Diagnosis present

## 2013-06-23 DIAGNOSIS — D696 Thrombocytopenia, unspecified: Secondary | ICD-10-CM | POA: Diagnosis present

## 2013-06-23 DIAGNOSIS — D72829 Elevated white blood cell count, unspecified: Secondary | ICD-10-CM

## 2013-06-23 DIAGNOSIS — R6520 Severe sepsis without septic shock: Secondary | ICD-10-CM

## 2013-06-23 DIAGNOSIS — S0181XA Laceration without foreign body of other part of head, initial encounter: Secondary | ICD-10-CM

## 2013-06-23 DIAGNOSIS — M129 Arthropathy, unspecified: Secondary | ICD-10-CM | POA: Diagnosis present

## 2013-06-23 DIAGNOSIS — E870 Hyperosmolality and hypernatremia: Secondary | ICD-10-CM | POA: Diagnosis present

## 2013-06-23 DIAGNOSIS — F0280 Dementia in other diseases classified elsewhere without behavioral disturbance: Secondary | ICD-10-CM | POA: Diagnosis present

## 2013-06-23 DIAGNOSIS — R059 Cough, unspecified: Secondary | ICD-10-CM

## 2013-06-23 DIAGNOSIS — F02818 Dementia in other diseases classified elsewhere, unspecified severity, with other behavioral disturbance: Secondary | ICD-10-CM | POA: Diagnosis present

## 2013-06-23 DIAGNOSIS — J9600 Acute respiratory failure, unspecified whether with hypoxia or hypercapnia: Secondary | ICD-10-CM | POA: Diagnosis present

## 2013-06-23 DIAGNOSIS — N179 Acute kidney failure, unspecified: Secondary | ICD-10-CM | POA: Diagnosis present

## 2013-06-23 DIAGNOSIS — I10 Essential (primary) hypertension: Secondary | ICD-10-CM | POA: Diagnosis present

## 2013-06-23 LAB — CBC WITH DIFFERENTIAL/PLATELET
BASOS PCT: 0 % (ref 0–1)
Basophils Absolute: 0 10*3/uL (ref 0.0–0.1)
Eosinophils Absolute: 0 10*3/uL (ref 0.0–0.7)
Eosinophils Relative: 0 % (ref 0–5)
HCT: 49.9 % (ref 39.0–52.0)
HEMOGLOBIN: 17.7 g/dL — AB (ref 13.0–17.0)
Lymphocytes Relative: 2 % — ABNORMAL LOW (ref 12–46)
Lymphs Abs: 0.3 10*3/uL — ABNORMAL LOW (ref 0.7–4.0)
MCH: 34.4 pg — ABNORMAL HIGH (ref 26.0–34.0)
MCHC: 35.5 g/dL (ref 30.0–36.0)
MCV: 97.1 fL (ref 78.0–100.0)
Monocytes Absolute: 0.8 10*3/uL (ref 0.1–1.0)
Monocytes Relative: 6 % (ref 3–12)
NEUTROS ABS: 13.5 10*3/uL — AB (ref 1.7–7.7)
NEUTROS PCT: 92 % — AB (ref 43–77)
PLATELETS: 193 10*3/uL (ref 150–400)
RBC: 5.14 MIL/uL (ref 4.22–5.81)
RDW: 13.1 % (ref 11.5–15.5)
WBC: 14.6 10*3/uL — ABNORMAL HIGH (ref 4.0–10.5)

## 2013-06-23 LAB — BASIC METABOLIC PANEL
BUN: 70 mg/dL — ABNORMAL HIGH (ref 6–23)
CALCIUM: 7.5 mg/dL — AB (ref 8.4–10.5)
CHLORIDE: 110 meq/L (ref 96–112)
CO2: 17 meq/L — AB (ref 19–32)
Creatinine, Ser: 4.15 mg/dL — ABNORMAL HIGH (ref 0.50–1.35)
GFR calc Af Amer: 14 mL/min — ABNORMAL LOW (ref >90)
GFR calc non Af Amer: 12 mL/min — ABNORMAL LOW (ref >90)
GLUCOSE: 94 mg/dL (ref 70–99)
Potassium: 3.3 mEq/L — ABNORMAL LOW (ref 3.7–5.3)
Sodium: 145 mEq/L (ref 137–147)

## 2013-06-23 LAB — URINE MICROSCOPIC-ADD ON

## 2013-06-23 LAB — POCT I-STAT 3, ART BLOOD GAS (G3+)
Acid-base deficit: 8 mmol/L — ABNORMAL HIGH (ref 0.0–2.0)
Bicarbonate: 15.1 meq/L — ABNORMAL LOW (ref 20.0–24.0)
O2 Saturation: 88 %
Patient temperature: 98.6
TCO2: 16 mmol/L (ref 0–100)
pCO2 arterial: 25.5 mmHg — ABNORMAL LOW (ref 35.0–45.0)
pH, Arterial: 7.382 (ref 7.350–7.450)
pO2, Arterial: 53 mmHg — ABNORMAL LOW (ref 80.0–100.0)

## 2013-06-23 LAB — POCT I-STAT, CHEM 8
BUN: 95 mg/dL — AB (ref 6–23)
CREATININE: 7.4 mg/dL — AB (ref 0.50–1.35)
Calcium, Ion: 1.06 mmol/L — ABNORMAL LOW (ref 1.13–1.30)
Chloride: 106 mEq/L (ref 96–112)
GLUCOSE: 157 mg/dL — AB (ref 70–99)
HCT: 52 % (ref 39.0–52.0)
HEMOGLOBIN: 17.7 g/dL — AB (ref 13.0–17.0)
POTASSIUM: 3.8 meq/L (ref 3.7–5.3)
Sodium: 141 mEq/L (ref 137–147)
TCO2: 20 mmol/L (ref 0–100)

## 2013-06-23 LAB — URINALYSIS, ROUTINE W REFLEX MICROSCOPIC
Bilirubin Urine: NEGATIVE
GLUCOSE, UA: NEGATIVE mg/dL
KETONES UR: NEGATIVE mg/dL
Leukocytes, UA: NEGATIVE
Nitrite: NEGATIVE
PH: 5 (ref 5.0–8.0)
PROTEIN: 30 mg/dL — AB
Specific Gravity, Urine: 1.022 (ref 1.005–1.030)
Urobilinogen, UA: 0.2 mg/dL (ref 0.0–1.0)

## 2013-06-23 LAB — INFLUENZA PANEL BY PCR (TYPE A & B)
H1N1 flu by pcr: NOT DETECTED
INFLAPCR: POSITIVE — AB
Influenza B By PCR: NEGATIVE

## 2013-06-23 LAB — COMPREHENSIVE METABOLIC PANEL
ALK PHOS: 77 U/L (ref 39–117)
ALT: 71 U/L — ABNORMAL HIGH (ref 0–53)
AST: 118 U/L — ABNORMAL HIGH (ref 0–37)
Albumin: 3.4 g/dL — ABNORMAL LOW (ref 3.5–5.2)
BILIRUBIN TOTAL: 1.3 mg/dL — AB (ref 0.3–1.2)
BUN: 95 mg/dL — ABNORMAL HIGH (ref 6–23)
CHLORIDE: 101 meq/L (ref 96–112)
CO2: 17 mEq/L — ABNORMAL LOW (ref 19–32)
Calcium: 8.9 mg/dL (ref 8.4–10.5)
Creatinine, Ser: 7.11 mg/dL — ABNORMAL HIGH (ref 0.50–1.35)
GFR calc Af Amer: 7 mL/min — ABNORMAL LOW (ref >90)
GFR calc non Af Amer: 6 mL/min — ABNORMAL LOW (ref >90)
Glucose, Bld: 159 mg/dL — ABNORMAL HIGH (ref 70–99)
POTASSIUM: 4 meq/L (ref 3.7–5.3)
SODIUM: 142 meq/L (ref 137–147)
Total Protein: 7.1 g/dL (ref 6.0–8.3)

## 2013-06-23 LAB — CG4 I-STAT (LACTIC ACID): Lactic Acid, Venous: 3.27 mmol/L — ABNORMAL HIGH (ref 0.5–2.2)

## 2013-06-23 LAB — MRSA PCR SCREENING: MRSA by PCR: NEGATIVE

## 2013-06-23 MED ORDER — SODIUM CHLORIDE 0.9 % IV SOLN
1000.0000 mL | INTRAVENOUS | Status: DC
  Administered 2013-06-23: 1000 mL via INTRAVENOUS

## 2013-06-23 MED ORDER — HEPARIN SODIUM (PORCINE) 5000 UNIT/ML IJ SOLN
5000.0000 [IU] | Freq: Three times a day (TID) | INTRAMUSCULAR | Status: DC
  Administered 2013-06-23 – 2013-06-26 (×10): 5000 [IU] via SUBCUTANEOUS
  Filled 2013-06-23 (×16): qty 1

## 2013-06-23 MED ORDER — OSELTAMIVIR PHOSPHATE 75 MG PO CAPS: 75.0000 mg | ORAL_CAPSULE | Freq: Two times a day (BID) | ORAL | Status: DC

## 2013-06-23 MED ORDER — ACETAMINOPHEN 325 MG PO TABS
650.0000 mg | ORAL_TABLET | Freq: Once | ORAL | Status: DC
  Filled 2013-06-23: qty 2

## 2013-06-23 MED ORDER — ENOXAPARIN SODIUM 30 MG/0.3ML ~~LOC~~ SOLN
30.0000 mg | SUBCUTANEOUS | Status: DC
  Filled 2013-06-23: qty 0.3

## 2013-06-23 MED ORDER — SODIUM CHLORIDE 0.9 % IV SOLN
1000.0000 mL | Freq: Once | INTRAVENOUS | Status: AC
  Administered 2013-06-23: 1000 mL via INTRAVENOUS

## 2013-06-23 MED ORDER — VANCOMYCIN HCL IN DEXTROSE 1-5 GM/200ML-% IV SOLN
1000.0000 mg | Freq: Once | INTRAVENOUS | Status: AC
  Administered 2013-06-23: 1000 mg via INTRAVENOUS
  Filled 2013-06-23: qty 200

## 2013-06-23 MED ORDER — ENOXAPARIN SODIUM 30 MG/0.3ML ~~LOC~~ SOLN
30.0000 mg | SUBCUTANEOUS | Status: DC
Start: 2013-06-23 — End: 2013-06-23
  Filled 2013-06-23: qty 0.3

## 2013-06-23 MED ORDER — OSELTAMIVIR PHOSPHATE 30 MG PO CAPS
30.0000 mg | ORAL_CAPSULE | Freq: Every day | ORAL | Status: DC
  Administered 2013-06-24: 30 mg via ORAL
  Filled 2013-06-23 (×5): qty 1

## 2013-06-23 MED ORDER — LORAZEPAM 2 MG/ML IJ SOLN
0.5000 mg | INTRAMUSCULAR | Status: DC | PRN
  Administered 2013-06-23: 0.5 mg via INTRAVENOUS

## 2013-06-23 MED ORDER — HALOPERIDOL LACTATE 5 MG/ML IJ SOLN
1.0000 mg | Freq: Four times a day (QID) | INTRAMUSCULAR | Status: DC | PRN
  Administered 2013-06-23 – 2013-06-27 (×10): 2 mg via INTRAVENOUS
  Filled 2013-06-23 (×10): qty 1

## 2013-06-23 MED ORDER — METOPROLOL TARTRATE 1 MG/ML IV SOLN
5.0000 mg | Freq: Four times a day (QID) | INTRAVENOUS | Status: DC
  Administered 2013-06-23 – 2013-06-24 (×3): 5 mg via INTRAVENOUS
  Filled 2013-06-23 (×7): qty 5

## 2013-06-23 MED ORDER — VANCOMYCIN HCL 500 MG IV SOLR
500.0000 mg | Freq: Once | INTRAVENOUS | Status: AC
  Administered 2013-06-23: 500 mg via INTRAVENOUS
  Filled 2013-06-23: qty 500

## 2013-06-23 MED ORDER — PIPERACILLIN-TAZOBACTAM IN DEX 2-0.25 GM/50ML IV SOLN
2.2500 g | Freq: Three times a day (TID) | INTRAVENOUS | Status: DC
  Administered 2013-06-23 – 2013-06-24 (×2): 2.25 g via INTRAVENOUS
  Filled 2013-06-23 (×4): qty 50

## 2013-06-23 MED ORDER — LORAZEPAM 2 MG/ML IJ SOLN
INTRAMUSCULAR | Status: AC
  Filled 2013-06-23: qty 1

## 2013-06-23 MED ORDER — ENOXAPARIN SODIUM 40 MG/0.4ML ~~LOC~~ SOLN: 40.0000 mg | SUBCUTANEOUS | Status: DC

## 2013-06-23 MED ORDER — ACETAMINOPHEN 650 MG RE SUPP
650.0000 mg | Freq: Four times a day (QID) | RECTAL | Status: DC | PRN
  Administered 2013-06-23 – 2013-06-28 (×4): 650 mg via RECTAL
  Filled 2013-06-23 (×4): qty 1

## 2013-06-23 MED ORDER — PIPERACILLIN-TAZOBACTAM 3.375 G IVPB 30 MIN
3.3750 g | Freq: Once | INTRAVENOUS | Status: AC
  Administered 2013-06-23: 3.375 g via INTRAVENOUS
  Filled 2013-06-23: qty 50

## 2013-06-23 MED ORDER — ACETAMINOPHEN 650 MG RE SUPP
650.0000 mg | Freq: Once | RECTAL | Status: AC
  Administered 2013-06-23: 650 mg via RECTAL
  Filled 2013-06-23: qty 1

## 2013-06-23 MED ORDER — SODIUM CHLORIDE 0.9 % IV SOLN
INTRAVENOUS | Status: DC
  Administered 2013-06-23 – 2013-06-24 (×2): via INTRAVENOUS

## 2013-06-23 MED ORDER — LEVOTHYROXINE SODIUM 100 MCG IV SOLR
44.0000 ug | Freq: Every day | INTRAVENOUS | Status: DC
  Administered 2013-06-24 – 2013-06-27 (×4): 44 ug via INTRAVENOUS
  Filled 2013-06-23 (×4): qty 5

## 2013-06-23 NOTE — Progress Notes (Signed)
Pt arrived from ED, tachycardic, tachypnec, confused. Now, agitated, in safety mittens, heart rate 120s- 160s. Paged DR. Will continue to monitor.

## 2013-06-23 NOTE — ED Notes (Signed)
i stat lactic acid [place] to [place], [place] fundraiser

## 2013-06-23 NOTE — ED Notes (Signed)
Per EMS pt came from home c/o flu like symptoms x1 week. Pt has congested cough. EMS reports pt was 93% on NRB mask. Pt ST on monitor with PVC and PACs. Pt has hx of dementia. CBG 172.

## 2013-06-23 NOTE — ED Notes (Signed)
Admitting MD at bedside.

## 2013-06-23 NOTE — H&P (Signed)
History and Physical    Dylan DELAUGHTER Roach:[id number] DOB: [birth year] DOA: 06/23/2013  Referring physician: Dr. Karma Ganja PCP: Hannah Beat, MD  Specialists: none  Chief Complaint: shortness of breath  HPI: Dylan Roach is a 80 y.o. male has a past medical history significant for Alzheimer dementia with behavioral disturbances, multiple recent falls at home with several ED and PCP visits, HTN, hypothyroidism, presents to the ED with a chief complaint of fevers, confusion, breathing problems, decreased urination for the past week, with initial improvement at home but worsening within the past few days. In the ED blood work pertinent for renal failure, lactic acidosis. He has a leukocytosis, CXR with multifocal pneumonia.   Review of Systems: unable to obtain ROS due to confusion.   Past Medical History  Diagnosis Date  . Arthritis   . Alzheimer's dementia with behavioral disturbance   . Hypertension   . Hypothyroid    Past Surgical History  Procedure Laterality Date  . Reconstruction medial collateral ligament elbow w/ tendon graft     Social History:  reports that he has never smoked. He has never used smokeless tobacco. He reports that he does not drink alcohol or use illicit drugs.  No Known Allergies  Family history non contributory  Prior to Admission medications   Medication Sig Start Date End Date Taking? Authorizing Provider  aspirin 81 MG EC tablet Take 81 mg by mouth daily.      Historical Provider, MD  citalopram (CELEXA) 40 MG tablet TAKE 1 TABLET BY MOUTH ONCE A DAY 03/01/13   Hannah Beat, MD  levothyroxine (SYNTHROID, LEVOTHROID) 88 MCG tablet TAKE 1 TABLET BY MOUTH ONCE A DAY 03/31/13   Spencer Copland, MD  losartan (COZAAR) 100 MG tablet TAKE 1 TABLET BY MOUTH ONCE A DAY 06/09/13   Spencer Copland, MD  QUEtiapine (SEROQUEL) 100 MG tablet TAKE 1 TABLET BY MOUTH TWICE A DAY 06/09/13   Hannah Beat, MD  tamsulosin (FLOMAX) 0.4 MG CAPS capsule TAKE 1  CAPSULE BY MOUTH ONCE DAILY 03/01/13   Hannah Beat, MD  vitamin B-12 (CYANOCOBALAMIN) 1000 MCG tablet Take 1,000 mcg by mouth daily.      Historical Provider, MD  vitamin D, CHOLECALCIFEROL, 400 UNITS tablet Take 400 Units by mouth daily.      Historical Provider, MD   Physical Exam: Filed Vitals:   06/23/13 1200 06/23/13 1315 06/23/13 1345 06/23/13 1359  BP: 147/87 168/77 137/64   Pulse: 28 117 35   Temp:      TempSrc:      Resp: 32 39 32   Height:      Weight:      SpO2: 96% 91%  99%    General:  With tachypnea, relatively alert.   Eyes: PERRL, EOMI, no scleral icterus  ENT: dry oropharynx  Neck: supple, no JVD  Cardiovascular: regular rate without MRG; 2+ peripheral pulses  Respiratory: coarse breath sounds bilaterally, no wheezing  Abdomen: soft, non tender to palpation, positive bowel sounds, no guarding, no rebound  Skin: no rashes  Musculoskeletal: no peripheral edema  Neurologic: moves all 4  Labs on Admission:  Basic Metabolic Panel:  Recent Labs Lab 06/23/13 1200 06/23/13 1239  NA 142 141  K 4.0 3.8  CL 101 106  CO2 17*  --   GLUCOSE 159* 157*  BUN 95* 95*  CREATININE 7.11* 7.40*  CALCIUM 8.9  --    Liver Function Tests:  Recent Labs Lab 06/23/13 1200  AST 118*  ALT  71*  ALKPHOS 77  BILITOT 1.3*  PROT 7.1  ALBUMIN 3.4*   CBC:  Recent Labs Lab 06/23/13 1200 06/23/13 1239  WBC 14.6*  --   NEUTROABS 13.5*  --   HGB 17.7* 17.7*  HCT 49.9 52.0  MCV 97.1  --   PLT 193  --    Radiological Exams on Admission: Dg Chest Port 1 View  06/23/2013   CLINICAL DATA:  Fever, cough and congestion with tachycardia.  EXAM: PORTABLE CHEST - 1 VIEW  COMPARISON:  11/03/2003  FINDINGS: Lungs are somewhat hypoinflated with patchy airspace opacification over the left mid to lower lung and right base suggesting a pneumonia. No definite effusion. Cardiomediastinal silhouette is unremarkable. There is mild degenerative change of the spine and suggestion  of an old left lateral 5th rib fracture.  IMPRESSION: Patchy airspace process over the left mid to lower lung and right base suggesting a multifocal pneumonia.   Electronically Signed   By: Elberta Fortisaniel  Boyle M.D.   On: 06/23/2013 12:39   EKG: Independently reviewed.  Assessment/Plan Principal Problem:   Bilateral pneumonia Active Problems:   HYPOTHYROIDISM   ESSENTIAL HYPERTENSION   Alzheimer's dementia   Laceration of face    Severe sepsis  - due to multifocal pneumonia. I discussed extensively with his wife and daughter and patient is a DNR/DNI at this point. Will focus on full medical management but short of resuscitation or intubation. Admit to SDU.  - he is not hypotensive  Multifocal PNA - broad spectrum vancomycin and zosyn, empiric Tamiflu and flu r/o. Dehydration - IVF, CBC appears hemoconcentrated.  Acute renal failure - in the setting of severe sepsis. Patient very dehydrated as with poor urine output and almost no po intake for the past few days. He seems to be responding well to IV fluids in the ED and has good urine output. Will continue hydration and closely monitor renal function and UOP. - baseline Cr 1.2 - 1.5 Lactic acidosis - due to #1. ABG with normal pH. Bicarb 15.  Leukocytosis - due to #1.  Hypothyroidism - resume synthroid IV at half dose tomorrow.  Alzheimer dementia - progressive and advanced per family.  HTN - hold antihypertensives LFT elevation - likely due to #1  Dylan Roach has severe sepsis due to multifocal pneumonia and evidence of ed organ damage. He is DNR/DNI. Family understands that his condition is severe and is very possible that he may not survive this hospitalization. Will continue medical management with antibiotics, fluids and admit to SDU for now. Daughter Dylan Roach can be reached at [phone], home phone [phone].   Diet: NPO Fluids: NS DVT Prophylaxis: heparin   Code Status: DNR/DNI  Family Communication: wife and daughter    Disposition Plan: SDU  Time spent: 7170  Bartlomiej Jenkinson M. Elvera LennoxGherghe, MD [facility] Pager [phone]  If 7PM-7AM, please contact night-coverage [email or web address] Password [email or web address] 06/23/2013, 2:50 PM

## 2013-06-23 NOTE — Progress Notes (Signed)
ANTIBIOTIC CONSULT NOTE - INITIAL  Pharmacy Consult:  Vancomycin / Zosyn Indication:  PNA  No Known Allergies  Patient Measurements: Height: 5' 6.93" (170 cm) Weight: 160 lb 11.5 oz (72.9 kg) IBW/kg (Calculated) : 65.94  Vital Signs: Temp: 101.6 F (38.7 C) (01/01 1144) Temp src: Rectal (01/01 1144) BP: 137/64 mmHg (01/01 1345) Pulse Rate: 35 (01/01 1345) Intake/Output from this shift: Total I/O In: 2050 [I.V.:2000; IV Piggyback:50] Out: 1250 [Urine:1250]  Labs:  Recent Labs  06/23/13 1200 06/23/13 1239  WBC 14.6*  --   HGB 17.7* 17.7*  PLT 193  --   CREATININE 7.11* 7.40*   Estimated Creatinine Clearance: 6.6 ml/min (by C-G formula based on Cr of 7.4). No results found for this basename: VANCOTROUGH, VANCOPEAK, VANCORANDOM, GENTTROUGH, GENTPEAK, GENTRANDOM, TOBRATROUGH, TOBRAPEAK, TOBRARND, AMIKACINPEAK, AMIKACINTROU, AMIKACIN,  in the last 72 hours   Microbiology: No results found for this or any previous visit (from the past 720 hour(s)).  Medical History: Past Medical History  Diagnosis Date  . Arthritis   . Alzheimer's dementia with behavioral disturbance   . Hypertension   . Hypothyroid      Assessment: [age] YOM previously a Code Sepsis and received first doses of vancomycin and Zosyn, now to continue antibiotics for PNA.  He has acute renal failure (SCr 7.4, was 1.2 in 2013) and plan is currently unknown.   Goal of Therapy:  Vancomycin trough level 15-20 mcg/ml   Plan:  - Vanc 500mg  IV x 1 now, total of 1500mg  loading dose - Zosyn 2.25gm IV Q8H - Change Tamiflu to 30mg  PO daily x 5 days d/t renal dysfunction - F/U renal function and adjust abx as appropriate - Monitor clinical course    Reginal Wojcicki D. Laney Potashang, PharmD, BCPS Pager:  [phone] 06/23/2013, 2:55 PM

## 2013-06-23 NOTE — ED Provider Notes (Signed)
CSN: [id number]     Arrival date & time 06/23/13  1134 History   First MD Initiated Contact with Patient 06/23/13 1151     Chief Complaint  Patient presents with  . Tachycardia   (Consider location/radiation/quality/duration/timing/severity/associated sxs/prior Treatment) HPI A LEVEL 5 CAVEAT PERTAINS DUE TO DEMENTIA.  Pt presents with family members who provide hx of cough, congestion over the past approx 1 week.  Today he seemed to be more lethargic and more difficulty breathing.    Past Medical History  Diagnosis Date  . Arthritis   . Alzheimer's dementia with behavioral disturbance   . Hypertension   . Hypothyroid    Past Surgical History  Procedure Laterality Date  . Reconstruction medial collateral ligament elbow w/ tendon graft     No family history on file. History  Substance Use Topics  . Smoking status: Never Smoker   . Smokeless tobacco: Never Used  . Alcohol Use: No    Review of Systems UNABLE TO OBTAIN ROS DUE TO LEVEL 5 CAVEAT  Allergies  Review of patient's allergies indicates no known allergies.  Home Medications   No current outpatient prescriptions on file. BP 120/78  Pulse 96  Temp(Src) 99.9 F (37.7 C) (Axillary)  Resp 25  Ht 5' 6.93" (1.7 m)  Wt 160 lb 11.5 oz (72.9 kg)  BMI 25.22 kg/m2  SpO2 94% Vitals reviewed Physical Exam Physical Examination: General appearance - alert, ill appearing, and in no distress Mental status - alert, oriented to person only Eyes - pupils equal and reactive, no conjunctival injection, no scleral icterus Mouth - mucous membranes moist, pharynx normal without lesions Chest - clear to auscultation, no wheezes, rales or rhonchi, symmetric air entry Heart - normal rate, regular rhythm, normal S1, S2, no murmurs, rubs, clicks or gallops Abdomen - soft, nontender, nondistended, no masses or organomegaly Extremities - peripheral pulses normal, no pedal edema, no clubbing or cyanosis Skin - normal coloration and  turgor, no rashes  ED Course  Procedures (including critical care time)  2:20 PM d/w [facility], he would like to come see patient prior to ordering bed.  ABG ordered.   CRITICAL CARE Performed by: Ethelda Chick Total critical care time: 40 Critical care time was exclusive of separately billable procedures and treating other patients. Critical care was necessary to treat or prevent imminent or life-threatening deterioration. Critical care was time spent personally by me on the following activities: development of treatment plan with patient and/or surrogate as well as nursing, discussions with consultants, evaluation of patient's response to treatment, examination of patient, obtaining history from patient or surrogate, ordering and performing treatments and interventions, ordering and review of laboratory studies, ordering and review of radiographic studies, pulse oximetry and re-evaluation of patient's condition. Labs Review Labs Reviewed  CBC WITH DIFFERENTIAL - Abnormal; Notable for the following:    WBC 14.6 (*)    Hemoglobin 17.7 (*)    MCH 34.4 (*)    Neutrophils Relative % 92 (*)    Neutro Abs 13.5 (*)    Lymphocytes Relative 2 (*)    Lymphs Abs 0.3 (*)    All other components within normal limits  COMPREHENSIVE METABOLIC PANEL - Abnormal; Notable for the following:    CO2 17 (*)    Glucose, Bld 159 (*)    BUN 95 (*)    Creatinine, Ser 7.11 (*)    Albumin 3.4 (*)    AST 118 (*)    ALT 71 (*)    Total  Bilirubin 1.3 (*)    GFR calc non Af Amer 6 (*)    GFR calc Af Amer 7 (*)    All other components within normal limits  URINALYSIS, ROUTINE W REFLEX MICROSCOPIC - Abnormal; Notable for the following:    Color, Urine AMBER (*)    APPearance CLOUDY (*)    Hgb urine dipstick MODERATE (*)    Protein, ur 30 (*)    All other components within normal limits  INFLUENZA PANEL BY PCR - Abnormal; Notable for the following:    Influenza A By PCR POSITIVE (*)    All other  components within normal limits  URINE MICROSCOPIC-ADD ON - Abnormal; Notable for the following:    Bacteria, UA FEW (*)    Casts HYALINE CASTS (*)    All other components within normal limits  BASIC METABOLIC PANEL - Abnormal; Notable for the following:    Potassium 3.3 (*)    CO2 17 (*)    BUN 70 (*)    Creatinine, Ser 4.15 (*)    Calcium 7.5 (*)    GFR calc non Af Amer 12 (*)    GFR calc Af Amer 14 (*)    All other components within normal limits  CBC - Abnormal; Notable for the following:    WBC 12.7 (*)    Platelets 144 (*)    All other components within normal limits  COMPREHENSIVE METABOLIC PANEL - Abnormal; Notable for the following:    Sodium 150 (*)    Chloride 115 (*)    CO2 18 (*)    BUN 69 (*)    Creatinine, Ser 3.56 (*)    Calcium 7.9 (*)    Total Protein 5.3 (*)    Albumin 2.5 (*)    AST 86 (*)    Total Bilirubin 1.4 (*)    GFR calc non Af Amer 14 (*)    GFR calc Af Amer 16 (*)    All other components within normal limits  BASIC METABOLIC PANEL - Abnormal; Notable for the following:    Sodium 155 (*)    Chloride 120 (*)    CO2 12 (*)    Glucose, Bld 69 (*)    BUN 79 (*)    Creatinine, Ser 3.46 (*)    GFR calc non Af Amer 15 (*)    GFR calc Af Amer 17 (*)    All other components within normal limits  GLUCOSE, CAPILLARY - Abnormal; Notable for the following:    Glucose-Capillary 66 (*)    All other components within normal limits  GLUCOSE, CAPILLARY - Abnormal; Notable for the following:    Glucose-Capillary 111 (*)    All other components within normal limits  CG4 I-STAT (LACTIC ACID) - Abnormal; Notable for the following:    Lactic Acid, Venous 3.27 (*)    All other components within normal limits  POCT I-STAT, CHEM 8 - Abnormal; Notable for the following:    BUN 95 (*)    Creatinine, Ser 7.40 (*)    Glucose, Bld 157 (*)    Calcium, Ion 1.06 (*)    Hemoglobin 17.7 (*)    All other components within normal limits  POCT I-STAT 3, BLOOD GAS (G3+)  - Abnormal; Notable for the following:    pCO2 arterial 25.5 (*)    pO2, Arterial 53.0 (*)    Bicarbonate 15.1 (*)    Acid-base deficit 8.0 (*)    All other components within normal limits  CULTURE, BLOOD (  ROUTINE X 2)  URINE CULTURE  MRSA PCR SCREENING  CULTURE, BLOOD (ROUTINE X 2)  CULTURE, EXPECTORATED SPUTUM-ASSESSMENT  GRAM STAIN  STREP PNEUMONIAE URINARY ANTIGEN  MAGNESIUM  PHOSPHORUS  VANCOMYCIN, RANDOM  LEGIONELLA ANTIGEN, URINE   Imaging Review Dg Chest Port 1 View  06/23/2013   CLINICAL DATA:  Fever, cough and congestion with tachycardia.  EXAM: PORTABLE CHEST - 1 VIEW  COMPARISON:  11/03/2003  FINDINGS: Lungs are somewhat hypoinflated with patchy airspace opacification over the left mid to lower lung and right base suggesting a pneumonia. No definite effusion. Cardiomediastinal silhouette is unremarkable. There is mild degenerative change of the spine and suggestion of an old left lateral 5th rib fracture.  IMPRESSION: Patchy airspace process over the left mid to lower lung and right base suggesting a multifocal pneumonia.   Electronically Signed   By: Elberta Fortis M.D.   On: 06/23/2013 12:39    EKG Interpretation    Date/Time:  Thursday June 23 2013 11:43:52 EST Ventricular Rate:  136 PR Interval:  168 QRS Duration: 115 QT Interval:  318 QTC Calculation: 478 R Axis:   89 Text Interpretation:  Sinus tachycardia Multiform ventricular premature complexes Right bundle branch block Low voltage, precordial leads Since previous tracing Premature ventricular complexes are new Confirmed by Karma Ganja  MD, Nicholson Starace [phone]) on 06/23/2013 12:56:06 PM            MDM   1. Severe sepsis   2. Hypoxia   3. Renal failure   4. Cough   5. Leukocytosis   6. Dehydration   7. Acute respiratory failure   8. Alzheimer's dementia   9. Influenza with pneumonia    Pt presenting with fever, decreased mental status, cough/cold symptoms.  He is febrile on arrival, hypoxic requiring O2,  tachycardic.  Has been maintaining his blood pressure.  Pt started on IV fluids, IV abx, labs reveal renal failure, leukocytosis.  Influenza pcr sent.  CXR shows mulifocal pneumonia.  D/w [facility] for admission to step down bed.      Ethelda Chick, MD 06/25/13 1058

## 2013-06-23 NOTE — ED Notes (Signed)
Attempted report 

## 2013-06-23 NOTE — ED Notes (Addendum)
  [name] (Daughter) - [phone]

## 2013-06-23 NOTE — ED Notes (Signed)
Family reports pt has hx of dementia but has been more lethargic over the past week. Pt has been incontinent of bladder which is not normal for him and has not been getting up at home. Pt has stage two ulcers to buttock. No signs of infection to ulcers. Pt currently alert but does not answer questions for orientation. Pt responds with eye movement to his name and continues to attempt to pull off wires and put pulse ox wire in his mouth. Pt tachypenic and breathing sounds rattled. Pt has congested cough and family denies sputum production. Family reports intermittent fevers at home with pt improving and declining again over the past week.

## 2013-06-24 DIAGNOSIS — J9600 Acute respiratory failure, unspecified whether with hypoxia or hypercapnia: Secondary | ICD-10-CM

## 2013-06-24 DIAGNOSIS — G309 Alzheimer's disease, unspecified: Secondary | ICD-10-CM

## 2013-06-24 DIAGNOSIS — F0280 Dementia in other diseases classified elsewhere without behavioral disturbance: Secondary | ICD-10-CM

## 2013-06-24 DIAGNOSIS — J1100 Influenza due to unidentified influenza virus with unspecified type of pneumonia: Secondary | ICD-10-CM

## 2013-06-24 LAB — MAGNESIUM: Magnesium: 2.5 mg/dL (ref 1.5–2.5)

## 2013-06-24 LAB — CBC
HEMATOCRIT: 43 % (ref 39.0–52.0)
HEMOGLOBIN: 14.9 g/dL (ref 13.0–17.0)
MCH: 33.9 pg (ref 26.0–34.0)
MCHC: 34.7 g/dL (ref 30.0–36.0)
MCV: 97.7 fL (ref 78.0–100.0)
Platelets: 144 10*3/uL — ABNORMAL LOW (ref 150–400)
RBC: 4.4 MIL/uL (ref 4.22–5.81)
RDW: 13.1 % (ref 11.5–15.5)
WBC: 12.7 10*3/uL — ABNORMAL HIGH (ref 4.0–10.5)

## 2013-06-24 LAB — URINE CULTURE
CULTURE: NO GROWTH
Colony Count: NO GROWTH

## 2013-06-24 LAB — COMPREHENSIVE METABOLIC PANEL
ALBUMIN: 2.5 g/dL — AB (ref 3.5–5.2)
ALT: 51 U/L (ref 0–53)
AST: 86 U/L — ABNORMAL HIGH (ref 0–37)
Alkaline Phosphatase: 86 U/L (ref 39–117)
BUN: 69 mg/dL — AB (ref 6–23)
CALCIUM: 7.9 mg/dL — AB (ref 8.4–10.5)
CO2: 18 mEq/L — ABNORMAL LOW (ref 19–32)
Chloride: 115 mEq/L — ABNORMAL HIGH (ref 96–112)
Creatinine, Ser: 3.56 mg/dL — ABNORMAL HIGH (ref 0.50–1.35)
GFR calc Af Amer: 16 mL/min — ABNORMAL LOW (ref >90)
GFR calc non Af Amer: 14 mL/min — ABNORMAL LOW (ref >90)
GLUCOSE: 86 mg/dL (ref 70–99)
Potassium: 4.4 mEq/L (ref 3.7–5.3)
SODIUM: 150 meq/L — AB (ref 137–147)
TOTAL PROTEIN: 5.3 g/dL — AB (ref 6.0–8.3)
Total Bilirubin: 1.4 mg/dL — ABNORMAL HIGH (ref 0.3–1.2)

## 2013-06-24 LAB — STREP PNEUMONIAE URINARY ANTIGEN: Strep Pneumo Urinary Antigen: NEGATIVE

## 2013-06-24 LAB — PHOSPHORUS: PHOSPHORUS: 4.1 mg/dL (ref 2.3–4.6)

## 2013-06-24 MED ORDER — SODIUM CHLORIDE 0.45 % IV SOLN: INTRAVENOUS | Status: DC

## 2013-06-24 MED ORDER — METOPROLOL TARTRATE 1 MG/ML IV SOLN
5.0000 mg | Freq: Four times a day (QID) | INTRAVENOUS | Status: DC
  Administered 2013-06-24 – 2013-06-27 (×11): 5 mg via INTRAVENOUS
  Filled 2013-06-24 (×12): qty 5

## 2013-06-24 MED ORDER — LEVOFLOXACIN IN D5W 750 MG/150ML IV SOLN
750.0000 mg | Freq: Once | INTRAVENOUS | Status: AC
  Administered 2013-06-24: 750 mg via INTRAVENOUS
  Filled 2013-06-24: qty 150

## 2013-06-24 MED ORDER — LEVOFLOXACIN 500 MG PO TABS: 500.0000 mg | ORAL_TABLET | ORAL | Status: DC

## 2013-06-24 NOTE — Progress Notes (Signed)
Utilization Review Completed.Dylan Roach T1/07/2013  

## 2013-06-24 NOTE — Progress Notes (Signed)
ANTIBIOTIC CONSULT NOTE - follow up  Pharmacy Consult for levaquin Indication: pneumonia  No Known Allergies  Patient Measurements: Height: 5' 6.93" (170 cm) Weight: 160 lb 11.5 oz (72.9 kg) IBW/kg (Calculated) : 65.94  Vital Signs: Temp: 98.2 F (36.8 C) (01/02 0800) Temp src: Oral (01/02 0800) BP: 107/65 mmHg (01/02 0330) Pulse Rate: 96 (01/02 0330) Intake/Output from previous day: 01/01 0701 - 01/02 0700 In: 3662.5 [I.V.:3562.5; IV Piggyback:100] Out: 1250 [Urine:1250] Intake/Output from this shift:    Labs:  Recent Labs  06/23/13 1200 06/23/13 1239 06/23/13 1846 06/24/13 0443  WBC 14.6*  --   --  12.7*  HGB 17.7* 17.7*  --  14.9  PLT 193  --   --  144*  CREATININE 7.11* 7.40* 4.15* 3.56*   Estimated Creatinine Clearance: 13.6 ml/min (by C-G formula based on Cr of 3.56). No results found for this basename: VANCOTROUGH, Leodis BinetVANCOPEAK, VANCORANDOM, GENTTROUGH, GENTPEAK, GENTRANDOM, TOBRATROUGH, TOBRAPEAK, TOBRARND, AMIKACINPEAK, AMIKACINTROU, AMIKACIN,  in the last 72 hours   Microbiology: Recent Results (from the past 720 hour(s))  MRSA PCR SCREENING     Status: None   Collection Time    06/23/13  4:22 PM      Result Value Range Status   MRSA by PCR NEGATIVE  NEGATIVE Final   Comment:            The GeneXpert MRSA Assay (FDA     approved for NASAL specimens     only), is one component of a     comprehensive MRSA colonization     surveillance program. It is not     intended to diagnose MRSA     infection nor to guide or     monitor treatment for     MRSA infections.   Assessment: Day # 2 of vanc/zosyn/tamiflu for PNA and + flu.  WBC 14.6>>12.7.  Creat 7.4>>4.15>>3.45.  Tmax 101.6, now AF.   Plan to stop vancomycin and zosyn and start levaquin for CAP and continue tamiflu for + flu.  Plan:  1. levaquin 750 mg IV x 1 dose today, then levaquin 500 mg PO q48 hours to start 06/26/12 Herby AbrahamMichelle T. Trang Bouse, Pharm.D. [phone] 06/24/2013 12:42 PM

## 2013-06-24 NOTE — Clinical Social Work Psychosocial (Signed)
Clinical Social Work Department BRIEF PSYCHOSOCIAL ASSESSMENT 06/24/2013  Patient:  Dylan Roach,Dylan Roach     Account Number:  [id number]     Admit date:  06/23/2013  Clinical Social Worker:  Delmer IslamRAWFORD,Jeremy Ditullio, LCSW  Date/Time:  06/24/2013 06:34 AM  Referred by:  Physician  Date Referred:  06/24/2013 Referred for  SNF Placement   Other Referral:   Interview type:  Family Other interview type:   CSW talked with patient's wife Dylan Roach [phone])    PSYCHOSOCIAL DATA Living Status:  WIFE Admitted from facility:   Level of care:   Primary support name:  Dylan Roach Primary support relationship to patient:  SPOUSE Degree of support available:   Strong support as wife is primary caregiver    CURRENT CONCERNS Current Concerns  Post-Acute Placement   Other Concerns:    SOCIAL WORK ASSESSMENT / PLAN CSW talked by phone with Dylan Roach regarding discharge plans and information CSW rec'd that family interested in LTC placement for patient.  Dylan Roach explained that she has been providing full care for the patient for several years and is no longer able to do so, however she expressed that patient will probably discharge home as they cannot afford placement. Wife also mentioned patient discharging home with Hospice services and CSW explained that these services are limited. [facility] and any care provided for several hours a day would be private pay. CSW also explained that patient going to a SNF would be private pay and she would need to apply for Medicaid after patient admitted to a facility.   Assessment/plan status:  Psychosocial Support/Ongoing Assessment of Needs Other assessment/ plan:   Information/referral to community resources:    PATIENT'S/FAMILY'S RESPONSE TO PLAN OF CARE: As of 06/24/13, wife is indicating that patient will discharge home as they cannot afford to pay privately at a skilled facility. Dylan Roach also expressed  interest in patient discharging home with Hospice services.

## 2013-06-24 NOTE — Progress Notes (Signed)
Posey belt on order. Kept Pt safe with safety mittens, bed alarm & frequent checks. Order states to initiate when restraint applied. Will continue to monitor.

## 2013-06-24 NOTE — Progress Notes (Signed)
Pt family wanted to removed NRB and replace with [place] stating that the mask was causing increase irritation. [place] placed and O2 sats 85-90. Family educated about O2 sats since pt not maintaining above >90%. Family stated they wanted to keep on [place].

## 2013-06-24 NOTE — Progress Notes (Signed)
[facility] Progress Note [place] TEAM 1 - Stepdown/ICU TEAM   ZACKARIA BURKEY XLK:[id number] DOB: [birth year] DOA: 06/23/2013 PCP: Hannah Beat, MD  Brief narrative: Dylan Roach is a 80 y.o. male has a past medical history significant for Alzheimer dementia with behavioral disturbances, multiple recent falls at home with several ED and PCP visits, HTN, hypothyroidism, presents to the ED with a chief complaint of fevers, confusion, breathing problems, decreased urination for the past week, with initial improvement at home but worsening within the past few days He was found to have pneumonia and tested Influenza A positive. Also, noted to be in significant renal failure.   Subjective: Very confused overnight- now sedated after Haldol.   Assessment/Plan: Principal Problem:   Acute respiratory failure/ Influenza with pneumonia/ sepsis - started on Tamiflu, Vanc and Zosyn - MRSA negative therefore will d/c Vanc - as we are unable to rule out bacterial pneumonia superimposed on the flu, will continue CAP coverage with Levaquin only.  - note-  he received the flu vaccine in late Dec.    Active Problems:    Alzheimer's dementia - PRN Haldol - per nursing, family would like placement for him   AKI/ acidosis - cont to hydrate and hold ARB    HYPOTHYROIDISM - IV Levothyroxine    ESSENTIAL HYPERTENSION - on IV Metoprolol with holding parameters  Hypernatremia - switch NS to 1/2 NS  Thrombocytopenia -likely viral - will follow - cont Heparin for now   Code Status: DNR Family Communication: none today Disposition Plan: likely SNF  Consultants: none  Procedures: none  Antibiotics: Vanc/ Zosyn 1/1- 1/2  DVT prophylaxis: Heparin  Objective: Blood pressure 107/65, pulse 96, temperature 98.2 F (36.8 C), temperature source Oral, resp. rate 33, height 5' 6.93" (1.7 m), weight 72.9 kg (160 lb 11.5 oz), SpO2 90.00%.  Intake/Output Summary (Last 24 hours)  at 06/24/13 1204 Last data filed at 06/24/13 0600  Gross per 24 hour  Intake 3662.5 ml  Output   1250 ml  Net 2412.5 ml     Exam: General: No acute respiratory distress Lungs: mild rhonchi b/l  Cardiovascular: Regular rate and rhythm without murmur gallop or rub normal S1 and S2 Abdomen: Nontender, nondistended, soft, bowel sounds positive, no rebound, no ascites, no appreciable mass Extremities: No significant cyanosis, clubbing, or edema bilateral lower extremities  Data Reviewed: Basic Metabolic Panel:  Recent Labs Lab 06/23/13 1200 06/23/13 1239 06/23/13 1846 06/24/13 0443  NA 142 141 145 150*  K 4.0 3.8 3.3* 4.4  CL 101 106 110 115*  CO2 17*  --  17* 18*  GLUCOSE 159* 157* 94 86  BUN 95* 95* 70* 69*  CREATININE 7.11* 7.40* 4.15* 3.56*  CALCIUM 8.9  --  7.5* 7.9*  MG  --   --   --  2.5  PHOS  --   --   --  4.1   Liver Function Tests:  Recent Labs Lab 06/23/13 1200 06/24/13 0443  AST 118* 86*  ALT 71* 51  ALKPHOS 77 86  BILITOT 1.3* 1.4*  PROT 7.1 5.3*  ALBUMIN 3.4* 2.5*   No results found for this basename: LIPASE, AMYLASE,  in the last 168 hours No results found for this basename: AMMONIA,  in the last 168 hours CBC:  Recent Labs Lab 06/23/13 1200 06/23/13 1239 06/24/13 0443  WBC 14.6*  --  12.7*  NEUTROABS 13.5*  --   --   HGB 17.7* 17.7* 14.9  HCT 49.9 52.0 43.0  MCV 97.1  --  97.7  PLT 193  --  144*   Cardiac Enzymes: No results found for this basename: CKTOTAL, CKMB, CKMBINDEX, TROPONINI,  in the last 168 hours BNP (last 3 results) No results found for this basename: PROBNP,  in the last 8760 hours CBG: No results found for this basename: GLUCAP,  in the last 168 hours  Recent Results (from the past 240 hour(s))  MRSA PCR SCREENING     Status: None   Collection Time    06/23/13  4:22 PM      Result Value Range Status   MRSA by PCR NEGATIVE  NEGATIVE Final   Comment:            The GeneXpert MRSA Assay (FDA     approved for NASAL  specimens     only), is one component of a     comprehensive MRSA colonization     surveillance program. It is not     intended to diagnose MRSA     infection nor to guide or     monitor treatment for     MRSA infections.     Studies:  Recent x-ray studies have been reviewed in detail by the Attending Physician  Scheduled Meds:  Scheduled Meds: . heparin subcutaneous  5,000 Units Subcutaneous Q8H  . levothyroxine  44 mcg Intravenous Daily  . metoprolol  5 mg Intravenous Q6H  . oseltamivir  30 mg Oral Daily  . piperacillin-tazobactam (ZOSYN)  IV  2.25 g Intravenous Q8H   Continuous Infusions: . sodium chloride      Time spent on care of this patient: 35 min   Abdishakur Gottschall, MD  [facility] Office  [phone] Pager - Text Page per Loretha StaplerAmion as per below:  On-Call/Text Page:      [email or web address]      password [email or web address]  If 7PM-7AM, please contact night-coverage [email or web address] Password [email or web address] 06/24/2013, 12:04 PM   LOS: 1 day

## 2013-06-25 DIAGNOSIS — F0390 Unspecified dementia without behavioral disturbance: Secondary | ICD-10-CM

## 2013-06-25 DIAGNOSIS — A4901 Methicillin susceptible Staphylococcus aureus infection, unspecified site: Secondary | ICD-10-CM

## 2013-06-25 DIAGNOSIS — R7881 Bacteremia: Secondary | ICD-10-CM

## 2013-06-25 DIAGNOSIS — R509 Fever, unspecified: Secondary | ICD-10-CM

## 2013-06-25 LAB — BASIC METABOLIC PANEL
BUN: 79 mg/dL — ABNORMAL HIGH (ref 6–23)
CO2: 12 meq/L — AB (ref 19–32)
Calcium: 8.5 mg/dL (ref 8.4–10.5)
Chloride: 120 mEq/L — ABNORMAL HIGH (ref 96–112)
Creatinine, Ser: 3.46 mg/dL — ABNORMAL HIGH (ref 0.50–1.35)
GFR calc Af Amer: 17 mL/min — ABNORMAL LOW (ref >90)
GFR calc non Af Amer: 15 mL/min — ABNORMAL LOW (ref >90)
GLUCOSE: 69 mg/dL — AB (ref 70–99)
Potassium: 4.2 mEq/L (ref 3.7–5.3)
SODIUM: 155 meq/L — AB (ref 137–147)

## 2013-06-25 LAB — GLUCOSE, CAPILLARY
GLUCOSE-CAPILLARY: 111 mg/dL — AB (ref 70–99)
Glucose-Capillary: 66 mg/dL — ABNORMAL LOW (ref 70–99)
Glucose-Capillary: 94 mg/dL (ref 70–99)
Glucose-Capillary: 103 mg/dL — ABNORMAL HIGH (ref 70–99)
Glucose-Capillary: 117 mg/dL — ABNORMAL HIGH (ref 70–99)

## 2013-06-25 LAB — URINALYSIS, ROUTINE W REFLEX MICROSCOPIC
Bilirubin Urine: NEGATIVE
Glucose, UA: NEGATIVE mg/dL
Ketones, ur: 15 mg/dL — AB
LEUKOCYTES UA: NEGATIVE
NITRITE: NEGATIVE
Protein, ur: NEGATIVE mg/dL
SPECIFIC GRAVITY, URINE: 1.02 (ref 1.005–1.030)
UROBILINOGEN UA: 0.2 mg/dL (ref 0.0–1.0)
pH: 5 (ref 5.0–8.0)

## 2013-06-25 LAB — VANCOMYCIN, RANDOM: VANCOMYCIN RM: 6.3 ug/mL

## 2013-06-25 LAB — LEGIONELLA ANTIGEN, URINE: LEGIONELLA ANTIGEN, URINE: NEGATIVE

## 2013-06-25 LAB — URINE MICROSCOPIC-ADD ON

## 2013-06-25 MED ORDER — CEFAZOLIN SODIUM 1-5 GM-% IV SOLN
1.0000 g | Freq: Two times a day (BID) | INTRAVENOUS | Status: DC
  Administered 2013-06-25 – 2013-06-26 (×3): 1 g via INTRAVENOUS
  Filled 2013-06-25 (×4): qty 50

## 2013-06-25 MED ORDER — DEXTROSE 5 % IV SOLN
INTRAVENOUS | Status: DC
  Administered 2013-06-25 – 2013-06-27 (×2): via INTRAVENOUS

## 2013-06-25 MED ORDER — DEXTROSE 50 % IV SOLN
25.0000 mL | Freq: Once | INTRAVENOUS | Status: AC | PRN
  Administered 2013-06-25: 25 mL via INTRAVENOUS

## 2013-06-25 MED ORDER — VANCOMYCIN HCL IN DEXTROSE 1-5 GM/200ML-% IV SOLN: 1000.0000 mg | INTRAVENOUS | Status: DC

## 2013-06-25 MED ORDER — DEXTROSE 50 % IV SOLN
INTRAVENOUS | Status: AC
  Filled 2013-06-25: qty 50

## 2013-06-25 MED ORDER — TAMSULOSIN HCL 0.4 MG PO CAPS
0.4000 mg | ORAL_CAPSULE | Freq: Every day | ORAL | Status: DC
  Filled 2013-06-25 (×3): qty 1

## 2013-06-25 MED ORDER — SODIUM CHLORIDE 0.9 % IV SOLN
1500.0000 mg | Freq: Once | INTRAVENOUS | Status: AC
  Administered 2013-06-25: 1500 mg via INTRAVENOUS
  Filled 2013-06-25: qty 1500

## 2013-06-25 NOTE — Progress Notes (Signed)
ANTIBIOTIC CONSULT NOTE - FOLLOW UP  Pharmacy Consult for  Cefazolin and Vancomycin Indication: bacteremia - Staph aureus  No Known Allergies  Patient Measurements: Height: 5' 6.93" (170 cm) Weight: 160 lb 11.5 oz (72.9 kg) IBW/kg (Calculated) : 65.94  Vital Signs: Temp: 97.9 F (36.6 C) (01/03 1205) Temp src: Axillary (01/03 1205) BP: 120/78 mmHg (01/03 0755) Pulse Rate: 96 (01/03 0755) Intake/Output from previous day: 01/02 0701 - 01/03 0700 In: 2400 [I.V.:2400] Out: 30 [Urine:30] Intake/Output from this shift: Total I/O In: 100 [I.V.:100] Out: -   Labs:  Recent Labs  06/23/13 1200 06/23/13 1239 06/23/13 1846 06/24/13 0443 06/25/13 0414  WBC 14.6*  --   --  12.7*  --   HGB 17.7* 17.7*  --  14.9  --   PLT 193  --   --  144*  --   CREATININE 7.11* 7.40* 4.15* 3.56* 3.46*   Estimated Creatinine Clearance: 14 ml/min (by C-G formula based on Cr of 3.46).  Recent Labs  06/25/13 0555  VANCORANDOM 6.3    Assessment:   Day # 3 Vancomycin. Resumed with 1500 mg IV at 8am today.  Levaquin stopped; Ancef to begin today.  Awaiting sensitivities of Staph aureus in blood.  Per ID, minimum of 4 weeks IV antibiotics planned.  Scr trending down some.    Goal of Therapy:  Vancomycin trough level 15-20 mcg/ml appropriate Cefazolin dose for renal function and infection  Plan:   Ancef 1 gram IV q12hrs.  Will plan to begin Vancomycin maintenance regimen of 1 gram IV q48hrs on 1/5.   Will follow up renal function for any need to adjust regimens.  Follow up culture data.    Of note, day # 3 of 5 of Tamiflu, but has only received 1 dose to date (on 1/2), since unable to swallow capsules.  Dennie FettersEgan, Jakari Jacot Donovan, [place] Pager: [phone] 06/25/2013,2:33 PM

## 2013-06-25 NOTE — Progress Notes (Addendum)
[facility] Progress Note [place] TEAM 1 - Stepdown/ICU TEAM   Dylan Roach:[id number] DOB: Nov 26, 1923 DOA: 06/23/2013 PCP: Hannah Beat, MD  Brief narrative: Dylan Roach is a 80 y.o. male has a past medical history significant for Alzheimer dementia with behavioral disturbances, multiple recent falls at home with several ED and PCP visits, HTN, hypothyroidism, presents to the ED with a chief complaint of fevers, confusion, breathing problems, decreased urination for the past week, with initial improvement at home but worsening within the past few days He was found to have pneumonia and tested Influenza A positive. Also, noted to be in significant renal failure.   Subjective: Pt has not been voiding - previously was incontinent- bladder scan reveal > 1000 cc of urine- foley placed and about 1500 cc or urine obtained.   Assessment/Plan: Principal Problem:   Acute respiratory failure/ Influenza with pneumonia - cont Tamiflu as tolerated   Sepsis/ Staph aureus bacteremia - blood cultures noted to be positive overnitght - Vanc and Ancef per ID- recommended total 4 wks - f/u repeat cultures prior to PICC - TEE recommended but pt DNR and family considering oupt hospice therefore, will not push for it   Active Problems:    Alzheimer's dementia - PRN Haldol - family would like to take him home with [facility]  - will consult social worker for palliative care in AM  AKI/ acidosis - cont to hydrate and hold ARB    HYPOTHYROIDISM - IV Levothyroxine    ESSENTIAL HYPERTENSION - on IV Metoprolol with holding parameters  Hypernatremia and Hypoglycemia - will switch 1/2 NS to D5 today  Thrombocytopenia -likely viral - will follow - cont Heparin for now   Code Status: DNR Family Communication: with patient's wife and daughter Disposition Plan: likely SNF  Consultants: none  Procedures: none  Antibiotics: Vanc/ Zosyn 1/1- 1/2 Levaquin 1/2-  1/3 Vanc and Ancef 1/3  DVT prophylaxis: Heparin  Objective: Blood pressure 121/55, pulse 91, temperature 97.9 F (36.6 C), temperature source Axillary, resp. rate 18, height 5' 6.93" (1.7 m), weight 72.9 kg (160 lb 11.5 oz), SpO2 94.00%.  Intake/Output Summary (Last 24 hours) at 06/25/13 1604 Last data filed at 06/25/13 1545  Gross per 24 hour  Intake   2500 ml  Output   1630 ml  Net    870 ml     Exam: General: No acute respiratory distress Lungs: mild rhonchi b/l - 94% on 6 L of O2 Cardiovascular: Regular rate and rhythm without murmur gallop or rub normal S1 and S2 Abdomen: Nontender, nondistended, soft, bowel sounds positive, no rebound, no ascites, no appreciable mass Extremities: No significant cyanosis, clubbing, or edema bilateral lower extremities  Data Reviewed: Basic Metabolic Panel:  Recent Labs Lab 06/23/13 1200 06/23/13 1239 06/23/13 1846 06/24/13 0443 06/25/13 0414  NA 142 141 145 150* 155*  K 4.0 3.8 3.3* 4.4 4.2  CL 101 106 110 115* 120*  CO2 17*  --  17* 18* 12*  GLUCOSE 159* 157* 94 86 69*  BUN 95* 95* 70* 69* 79*  CREATININE 7.11* 7.40* 4.15* 3.56* 3.46*  CALCIUM 8.9  --  7.5* 7.9* 8.5  MG  --   --   --  2.5  --   PHOS  --   --   --  4.1  --    Liver Function Tests:  Recent Labs Lab 06/23/13 1200 06/24/13 0443  AST 118* 86*  ALT 71* 51  ALKPHOS 77 86  BILITOT  1.3* 1.4*  PROT 7.1 5.3*  ALBUMIN 3.4* 2.5*   No results found for this basename: LIPASE, AMYLASE,  in the last 168 hours No results found for this basename: AMMONIA,  in the last 168 hours CBC:  Recent Labs Lab 06/23/13 1200 06/23/13 1239 06/24/13 0443  WBC 14.6*  --  12.7*  NEUTROABS 13.5*  --   --   HGB 17.7* 17.7* 14.9  HCT 49.9 52.0 43.0  MCV 97.1  --  97.7  PLT 193  --  144*   Cardiac Enzymes: No results found for this basename: CKTOTAL, CKMB, CKMBINDEX, TROPONINI,  in the last 168 hours BNP (last 3 results) No results found for this basename: PROBNP,  in  the last 8760 hours CBG:  Recent Labs Lab 06/25/13 0745 06/25/13 0827 06/25/13 1122  GLUCAP 66* 111* 94    Recent Results (from the past 240 hour(s))  CULTURE, BLOOD (ROUTINE X 2)     Status: None   Collection Time    06/23/13 12:15 PM      Result Value Range Status   Specimen Description BLOOD RIGHT FOREARM   Final   Special Requests BOTTLES DRAWN AEROBIC AND ANAEROBIC   Final   Culture  Setup Time     Final   Value: 06/24/2013 01:29     Performed at [facility]   Culture     Final   Value: GRAM POSITIVE COCCI IN CLUSTERS     Note: Gram Stain Report Called to,Read Back By and Verified With: WHITNEY DAVIS 06/25/13 @ 1:22PM BY RUSCOE A.     Performed at [facility]   Report Status PENDING   Incomplete  CULTURE, BLOOD (ROUTINE X 2)     Status: None   Collection Time    06/23/13 12:25 PM      Result Value Range Status   Specimen Description BLOOD LEFT ANTECUBITAL   Final   Special Requests BOTTLES DRAWN AEROBIC AND ANAEROBIC   Final   Culture  Setup Time     Final   Value: 06/24/2013 01:28     Performed at [facility]   Culture     Final   Value: STAPHYLOCOCCUS AUREUS     Note: Gram Stain Report Called to,Read Back By and Verified With: Vivi Martens [id number]     Performed at [facility]   Report Status PENDING   Incomplete  URINE CULTURE     Status: None   Collection Time    06/23/13  1:15 PM      Result Value Range Status   Specimen Description URINE, CATHETERIZED   Final   Special Requests NONE   Final   Culture  Setup Time     Final   Value: 06/24/2013 01:33     Performed at [facility]   Colony Count     Final   Value: NO GROWTH     Performed at [facility]   Culture     Final   Value: NO GROWTH     Performed at [facility]   Report Status 06/24/2013 FINAL   Final  MRSA PCR SCREENING     Status: None   Collection Time    06/23/13  4:22 PM      Result Value Range Status    MRSA by PCR NEGATIVE  NEGATIVE Final   Comment:            The GeneXpert MRSA  Assay (FDA     approved for NASAL specimens     only), is one component of a     comprehensive MRSA colonization     surveillance program. It is not     intended to diagnose MRSA     infection nor to guide or     monitor treatment for     MRSA infections.     Studies:  Recent x-ray studies have been reviewed in detail by the Attending Physician  Scheduled Meds:  Scheduled Meds: .  ceFAZolin (ANCEF) IV  1 g Intravenous Q12H  . heparin subcutaneous  5,000 Units Subcutaneous Q8H  . levothyroxine  44 mcg Intravenous Daily  . metoprolol  5 mg Intravenous Q6H  . oseltamivir  30 mg Oral Daily  . [START ON 06/27/2013] vancomycin  1,000 mg Intravenous Q48H   Continuous Infusions: . dextrose 100 mL/hr at 06/25/13 [id number]    Time spent on care of this patient: 35 min   Keston Seever, MD  [facility] Office  [phone] Pager - Text Page per Loretha StaplerAmion as per below:  On-Call/Text Page:      [email or web address]      password [email or web address]  If 7PM-7AM, please contact night-coverage [email or web address] Password [email or web address] 06/25/2013, 4:04 PM   LOS: 2 days

## 2013-06-25 NOTE — Consult Note (Signed)
[facility]  Total days of antibiotics 3        Day 3 oseltamivir        Day 2 vanco        Day 2 levo       Reason for Consult: staph aureus bacteremia/ pneumonia    Referring Physician: rizwan  Principal Problem:   Acute respiratory failure Active Problems:   HYPOTHYROIDISM   ESSENTIAL HYPERTENSION   Alzheimer's dementia   Laceration of face   Bilateral pneumonia   Influenza with pneumonia    HPI: Dylan Roach is a 80 y.o. male  With Alzheimer's dementia with behavioral disturbances who was admitted on 06/23/13 for  fevers, confusion, breathing problems, decreased urination for the past week, with initial improvement at home but worsening within the past few days. In the ED blood work pertinent for renal failure, lactic acidosis, mild leukocytosis, CXR with multifocal pneumonia. He is flu positive, started on oseltamivir plus vanco/piptazo for hcap. His infectious work up also revealed that he has staph aureus bacteremia.   Past Medical History  Diagnosis Date  . Arthritis   . Alzheimer's dementia with behavioral disturbance   . Hypertension   . Hypothyroid     Allergies: No Known Allergies   MEDICATIONS: . heparin subcutaneous  5,000 Units Subcutaneous Q8H  . levothyroxine  44 mcg Intravenous Daily  . metoprolol  5 mg Intravenous Q6H  . oseltamivir  30 mg Oral Daily    History  Substance Use Topics  . Smoking status: Never Smoker   . Smokeless tobacco: Never Used  . Alcohol Use: No    No family history on file. Review of Systems  unable to obtain due to nonverbal  OBJECTIVE: Temp:  [97.3 F (36.3 C)-99.9 F (37.7 C)] 97.9 F (36.6 C) (01/03 1205) Pulse Rate:  [82-96] 96 (01/03 0755) Resp:  [22-25] 25 (01/03 0755) BP: (108-167)/(63-89) 120/78 mmHg (01/03 0755) SpO2:  [92 %-94 %] 94 % (01/03 0755)  Physical Exam  Constitutional: appears stated age, thin frame, not following directions, laying in bed with eyes closed  moving all appendages attempting to pull on cardiac leads HENT: [place]/AT, PERRLA Mouth/Throat: Oropharynx is dry. Blood crusted.lips No oropharyngeal exudate.  Cardiovascular: tachycardic, regular rhythm and normal heart sounds. Exam reveals no gallop and no friction rub.  No murmur heard.  Pulmonary/Chest: Effort normal and breath sounds normal. No respiratory distress. He has no wheezes.  Abdominal: Soft. Bowel sounds are normal. He exhibits no distension. There is no tenderness.  Lymphadenopathy:  He has no cervical adenopathy.  Neurological: moves all extremities, does not follow commands Skin: Skin is warm and dry. No rash noted. No erythema.  Psychiatric: non verbal, not agitated at this time   LABS: Results for orders placed during the hospital encounter of 06/23/13 (from the past 48 hour(s))  INFLUENZA PANEL BY PCR     Status: Abnormal   Collection Time    06/23/13  1:05 PM      Result Value Range   Influenza A By PCR POSITIVE (*) NEGATIVE   Influenza B By PCR NEGATIVE  NEGATIVE   H1N1 flu by pcr NOT DETECTED  NOT DETECTED   Comment:            The Xpert Flu assay (FDA approved for     nasal aspirates or washes and     nasopharyngeal swab specimens), is     intended as an aid in the diagnosis of  influenza and should not be used as     a sole basis for treatment.  URINALYSIS, ROUTINE W REFLEX MICROSCOPIC     Status: Abnormal   Collection Time    06/23/13  1:15 PM      Result Value Range   Color, Urine AMBER (*) YELLOW   Comment: BIOCHEMICALS MAY BE AFFECTED BY COLOR   APPearance CLOUDY (*) CLEAR   Specific Gravity, Urine 1.022  1.005 - 1.030   pH 5.0  5.0 - 8.0   Glucose, UA NEGATIVE  NEGATIVE mg/dL   Hgb urine dipstick MODERATE (*) NEGATIVE   Bilirubin Urine NEGATIVE  NEGATIVE   Ketones, ur NEGATIVE  NEGATIVE mg/dL   Protein, ur 30 (*) NEGATIVE mg/dL   Urobilinogen, UA 0.2  0.0 - 1.0 mg/dL   Nitrite NEGATIVE  NEGATIVE   Leukocytes, UA NEGATIVE  NEGATIVE  URINE  CULTURE     Status: None   Collection Time    06/23/13  1:15 PM      Result Value Range   Specimen Description URINE, CATHETERIZED     Special Requests NONE     Culture  Setup Time       Value: 06/24/2013 01:33     Performed at [facility]       Value: NO GROWTH     Performed at [facility]   Culture       Value: NO GROWTH     Performed at [facility]   Report Status 06/24/2013 FINAL    URINE MICROSCOPIC-ADD ON     Status: Abnormal   Collection Time    06/23/13  1:15 PM      Result Value Range   Squamous Epithelial / LPF RARE  RARE   WBC, UA 0-2  <3 WBC/hpf   RBC / HPF 21-50  <3 RBC/hpf   Bacteria, UA FEW (*) RARE   Casts HYALINE CASTS (*) NEGATIVE  POCT I-STAT 3, BLOOD GAS (G3+)     Status: Abnormal   Collection Time    06/23/13  2:35 PM      Result Value Range   pH, Arterial 7.382  7.350 - 7.450   pCO2 arterial 25.5 (*) 35.0 - 45.0 mmHg   pO2, Arterial 53.0 (*) 80.0 - 100.0 mmHg   Bicarbonate 15.1 (*) 20.0 - 24.0 mEq/L   TCO2 16  0 - 100 mmol/L   O2 Saturation 88.0     Acid-base deficit 8.0 (*) 0.0 - 2.0 mmol/L   Patient temperature 98.6 F     Collection site RADIAL, ALLEN'S TEST ACCEPTABLE     Sample type ARTERIAL    MRSA PCR SCREENING     Status: None   Collection Time    06/23/13  4:22 PM      Result Value Range   MRSA by PCR NEGATIVE  NEGATIVE   Comment:            The GeneXpert MRSA Assay (FDA     approved for NASAL specimens     only), is one component of a     comprehensive MRSA colonization     surveillance program. It is not     intended to diagnose MRSA     infection nor to guide or     monitor treatment for     MRSA infections.  BASIC METABOLIC PANEL     Status: Abnormal   Collection Time    06/23/13  6:46 PM  Result Value Range   Sodium 145  137 - 147 mEq/L   Comment: Please note change in reference range.   Potassium 3.3 (*) 3.7 - 5.3 mEq/L   Comment: Please note change in reference range.    Chloride 110  96 - 112 mEq/L   CO2 17 (*) 19 - 32 mEq/L   Glucose, Bld 94  70 - 99 mg/dL   BUN 70 (*) 6 - 23 mg/dL   Creatinine, Ser 4.15 (*) 0.50 - 1.35 mg/dL   Comment: DELTA CHECK NOTED   Calcium 7.5 (*) 8.4 - 10.5 mg/dL   GFR calc non Af Amer 12 (*) >90 mL/min   GFR calc Af Amer 14 (*) >90 mL/min   Comment: (NOTE)     The eGFR has been calculated using the CKD EPI equation.     This calculation has not been validated in all clinical situations.     eGFR's persistently <90 mL/min signify possible Chronic Kidney     Disease.  CBC     Status: Abnormal   Collection Time    06/24/13  4:43 AM      Result Value Range   WBC 12.7 (*) 4.0 - 10.5 K/uL   RBC 4.40  4.22 - 5.81 MIL/uL   Hemoglobin 14.9  13.0 - 17.0 g/dL   HCT 43.0  39.0 - 52.0 %   MCV 97.7  78.0 - 100.0 fL   MCH 33.9  26.0 - 34.0 pg   MCHC 34.7  30.0 - 36.0 g/dL   RDW 13.1  11.5 - 15.5 %   Platelets 144 (*) 150 - 400 K/uL   Comment: DELTA CHECK NOTED     REPEATED TO VERIFY  MAGNESIUM     Status: None   Collection Time    06/24/13  4:43 AM      Result Value Range   Magnesium 2.5  1.5 - 2.5 mg/dL  PHOSPHORUS     Status: None   Collection Time    06/24/13  4:43 AM      Result Value Range   Phosphorus 4.1  2.3 - 4.6 mg/dL  COMPREHENSIVE METABOLIC PANEL     Status: Abnormal   Collection Time    06/24/13  4:43 AM      Result Value Range   Sodium 150 (*) 137 - 147 mEq/L   Comment: Please note change in reference range.   Potassium 4.4  3.7 - 5.3 mEq/L   Comment: Please note change in reference range.     DELTA CHECK NOTED   Chloride 115 (*) 96 - 112 mEq/L   CO2 18 (*) 19 - 32 mEq/L   Glucose, Bld 86  70 - 99 mg/dL   BUN 69 (*) 6 - 23 mg/dL   Creatinine, Ser 3.56 (*) 0.50 - 1.35 mg/dL   Calcium 7.9 (*) 8.4 - 10.5 mg/dL   Total Protein 5.3 (*) 6.0 - 8.3 g/dL   Albumin 2.5 (*) 3.5 - 5.2 g/dL   AST 86 (*) 0 - 37 U/L   ALT 51  0 - 53 U/L   Alkaline Phosphatase 86  39 - 117 U/L   Total Bilirubin 1.4 (*) 0.3 - 1.2  mg/dL   GFR calc non Af Amer 14 (*) >90 mL/min   GFR calc Af Amer 16 (*) >90 mL/min   Comment: (NOTE)     The eGFR has been calculated using the CKD EPI equation.     This calculation has not been validated  in all clinical situations.     eGFR's persistently <90 mL/min signify possible Chronic Kidney     Disease.  STREP PNEUMONIAE URINARY ANTIGEN     Status: None   Collection Time    06/24/13 10:24 AM      Result Value Range   Strep Pneumo Urinary Antigen NEGATIVE  NEGATIVE   Comment:            Infection due to S. pneumoniae     cannot be absolutely ruled out     since the antigen present     may be below the detection limit     of the test.  BASIC METABOLIC PANEL     Status: Abnormal   Collection Time    06/25/13  4:14 AM      Result Value Range   Sodium 155 (*) 137 - 147 mEq/L   Comment: Please note change in reference range.   Potassium 4.2  3.7 - 5.3 mEq/L   Comment: Please note change in reference range.   Chloride 120 (*) 96 - 112 mEq/L   CO2 12 (*) 19 - 32 mEq/L   Glucose, Bld 69 (*) 70 - 99 mg/dL   BUN 79 (*) 6 - 23 mg/dL   Creatinine, Ser 3.46 (*) 0.50 - 1.35 mg/dL   Calcium 8.5  8.4 - 10.5 mg/dL   GFR calc non Af Amer 15 (*) >90 mL/min   GFR calc Af Amer 17 (*) >90 mL/min   Comment: (NOTE)     The eGFR has been calculated using the CKD EPI equation.     This calculation has not been validated in all clinical situations.     eGFR's persistently <90 mL/min signify possible Chronic Kidney     Disease.  VANCOMYCIN, RANDOM     Status: None   Collection Time    06/25/13  5:55 AM      Result Value Range   Vancomycin Rm 6.3     Comment:            Random Vancomycin therapeutic     range is dependent on dosage and     time of specimen collection.     A peak range is 20.0-40.0 ug/mL     A trough range is 5.0-15.0 ug/mL             GLUCOSE, CAPILLARY     Status: Abnormal   Collection Time    06/25/13  7:45 AM      Result Value Range   Glucose-Capillary 66 (*)  70 - 99 mg/dL  GLUCOSE, CAPILLARY     Status: Abnormal   Collection Time    06/25/13  8:27 AM      Result Value Range   Glucose-Capillary 111 (*) 70 - 99 mg/dL  GLUCOSE, CAPILLARY     Status: None   Collection Time    06/25/13 11:22 AM      Result Value Range   Glucose-Capillary 94  70 - 99 mg/dL    MICRO: 1/1 urine cx NGTD 1/1 blood cx 1 of 2 sets staph aureus (sensi pending) 1/1 flu A positive  IMAGING: cxr 06/23/13 FINDINGS:  Lungs are somewhat hypoinflated with patchy airspace opacification  over the left mid to lower lung and right base suggesting a  pneumonia. No definite effusion. Cardiomediastinal silhouette is  unremarkable. There is mild degenerative change of the spine and  suggestion of an old left lateral 5th rib fracture.  IMPRESSION:  Patchy airspace process  over the left mid to lower lung and right  base suggesting a multifocal pneumonia.   Assessment/Plan:  80yo M with advanced dementia who presents with fever, ams, dyspnea found to have influenza with secondary bacterial pneumonia plus bacteremia with staph aureus. Often see 2nd bacterial infection with staph or strep as the main pathogens  - continue to treat with vancomycin and will add cefazolin until sensitivity of staph aureus returns - will discontinue levo since not needed in addition to cefazolin. Also FQ associate with more confusion in the elderly and cdiff - will repeat blood cultures today - will recommend TTE to evaluate for endocarditis. Won't push for TEE due to DNR and risk for aspiration in demented patient - recommended course of therapy is minimum of 4 wks of IV therapy - do not place picc line until we can document clearance of bacteremia  - for influenza - > treat for 5 days with oseltamivir. Keep on droplet for 7 days since onset of symptoms.  Thank you for consultation.  Elzie Rings [facility] [phone]

## 2013-06-25 NOTE — Progress Notes (Signed)
CRITICAL VALUE ALERT  Critical value received:  + blood cultures   Date of notification:  06/25/2013  Time of notification:  0220  Critical value read back:yes  Nurse who received alert:  Dorthula RueBrittany Musial  MD notified (1st page):  Benedetto Coons. Callahan  Time of first page:  0310  MD notified (2nd page):  Time of second page:  Responding MD:  Benedetto Coons. Callahan  Time MD responded:  [phone]

## 2013-06-25 NOTE — Progress Notes (Signed)
ANTIBIOTIC CONSULT NOTE - INITIAL  Pharmacy Consult for vancomycin Indication: GPC in blood Cx  No Known Allergies  Patient Measurements: Height: 5' 6.93" (170 cm) Weight: 160 lb 11.5 oz (72.9 kg) IBW/kg (Calculated) : 65.94  Vital Signs: Temp: 98.7 F (37.1 C) (01/03 0400) Temp src: Axillary (01/03 0400) BP: 108/74 mmHg (01/03 0445) Pulse Rate: 95 (01/03 0445) Intake/Output from previous day: 01/02 0701 - 01/03 0700 In: 2300 [I.V.:2300] Out: 30 [Urine:30] Intake/Output from this shift: Total I/O In: 1100 [I.V.:1100] Out: -   Labs:  Recent Labs  06/23/13 1200 06/23/13 1239 06/23/13 1846 06/24/13 0443 06/25/13 0414  WBC 14.6*  --   --  12.7*  --   HGB 17.7* 17.7*  --  14.9  --   PLT 193  --   --  144*  --   CREATININE 7.11* 7.40* 4.15* 3.56* 3.46*   Estimated Creatinine Clearance: 14 ml/min (by C-G formula based on Cr of 3.46).  Recent Labs  06/25/13 0555  VANCORANDOM 6.3     Microbiology: Recent Results (from the past 720 hour(s))  CULTURE, BLOOD (ROUTINE X 2)     Status: None   Collection Time    06/23/13 12:25 PM      Result Value Range Status   Specimen Description BLOOD LEFT ANTECUBITAL   Final   Special Requests BOTTLES DRAWN AEROBIC AND ANAEROBIC   Final   Culture  Setup Time     Final   Value: 06/24/2013 01:28     Performed at [facility]   Culture     Final   Value: GRAM POSITIVE COCCI IN CLUSTERS     Note: Gram Stain Report Called to,Read Back By and Verified With: Vivi Martens [id number] BRMEL     Performed at [facility]   Report Status PENDING   Incomplete  URINE CULTURE     Status: None   Collection Time    06/23/13  1:15 PM      Result Value Range Status   Specimen Description URINE, CATHETERIZED   Final   Special Requests NONE   Final   Culture  Setup Time     Final   Value: 06/24/2013 01:33     Performed at [facility] Count     Final   Value: NO GROWTH     Performed at [facility]   Culture     Final   Value: NO GROWTH     Performed at [facility]   Report Status 06/24/2013 FINAL   Final  MRSA PCR SCREENING     Status: None   Collection Time    06/23/13  4:22 PM      Result Value Range Status   MRSA by PCR NEGATIVE  NEGATIVE Final   Comment:            The GeneXpert MRSA Assay (FDA     approved for NASAL specimens     only), is one component of a     comprehensive MRSA colonization     surveillance program. It is not     intended to diagnose MRSA     infection nor to guide or     monitor treatment for     MRSA infections.    Medical History: Past Medical History  Diagnosis Date  . Arthritis   . Alzheimer's dementia with behavioral disturbance   . Hypertension   . Hypothyroid  Medications:  Prescriptions prior to admission  Medication Sig Dispense Refill  . aspirin 81 MG EC tablet Take 81 mg by mouth daily.        . citalopram (CELEXA) 20 MG tablet Take 20 mg by mouth daily.      Marland Kitchen. levothyroxine (SYNTHROID, LEVOTHROID) 88 MCG tablet TAKE 1 TABLET BY MOUTH ONCE A DAY  30 tablet  5  . losartan (COZAAR) 100 MG tablet TAKE 1 TABLET BY MOUTH ONCE A DAY  30 tablet  5  . OVER THE COUNTER MEDICATION Apply 1 application topically 2 (two) times daily. Cream for rashes on bottom      . QUEtiapine (SEROQUEL) 100 MG tablet TAKE 1 TABLET BY MOUTH TWICE A DAY  60 tablet  5  . tamsulosin (FLOMAX) 0.4 MG CAPS capsule TAKE 1 CAPSULE BY MOUTH ONCE DAILY  30 capsule  11  . vitamin B-12 (CYANOCOBALAMIN) 500 MCG tablet Take 500 mcg by mouth daily.      . vitamin D, CHOLECALCIFEROL, 400 UNITS tablet Take 400 Units by mouth daily.         Scheduled:  . heparin subcutaneous  5,000 Units Subcutaneous Q8H  . [START ON 06/26/2013] levofloxacin  500 mg Oral Q48H  . levothyroxine  44 mcg Intravenous Daily  . metoprolol  5 mg Intravenous Q6H  . oseltamivir  30 mg Oral Daily   Infusions:  . sodium chloride 100 mL/hr at 06/25/13 0400     Assessment: 80yo male was admitted 1/1 for multifocal PNA, broad-spectrum ABX started and then narrowed, now w/ GPC in blood Cx, to resume vancomycin; SCR remains elevated though improved since admission; rec'd vanc 1500mg  on 1/1, has cleared well w/ random level now 6.3.  Goal of Therapy:  Vancomycin trough level 15-20 mcg/ml  Plan:  Will give additional vancomycin 1500mg  IV x1 now and continue to monitor SCr and levels to further guide dosing.  Vernard GamblesVeronda Ezechiel [place], PharmD, BCPS  06/25/2013,6:56 AM

## 2013-06-26 ENCOUNTER — Inpatient Hospital Stay (HOSPITAL_COMMUNITY): Payer: Medicare Other

## 2013-06-26 DIAGNOSIS — J189 Pneumonia, unspecified organism: Secondary | ICD-10-CM

## 2013-06-26 LAB — CBC
HEMATOCRIT: 43.5 % (ref 39.0–52.0)
Hemoglobin: 15.3 g/dL (ref 13.0–17.0)
MCH: 33.9 pg (ref 26.0–34.0)
MCHC: 35.2 g/dL (ref 30.0–36.0)
MCV: 96.5 fL (ref 78.0–100.0)
Platelets: 181 10*3/uL (ref 150–400)
RBC: 4.51 MIL/uL (ref 4.22–5.81)
RDW: 13.5 % (ref 11.5–15.5)
WBC: 13.2 10*3/uL — ABNORMAL HIGH (ref 4.0–10.5)

## 2013-06-26 LAB — BASIC METABOLIC PANEL
BUN: 54 mg/dL — AB (ref 6–23)
CHLORIDE: 120 meq/L — AB (ref 96–112)
CO2: 16 mEq/L — ABNORMAL LOW (ref 19–32)
CREATININE: 1.8 mg/dL — AB (ref 0.50–1.35)
Calcium: 8.2 mg/dL — ABNORMAL LOW (ref 8.4–10.5)
GFR calc Af Amer: 37 mL/min — ABNORMAL LOW (ref >90)
GFR calc non Af Amer: 32 mL/min — ABNORMAL LOW (ref >90)
GLUCOSE: 150 mg/dL — AB (ref 70–99)
POTASSIUM: 3.6 meq/L — AB (ref 3.7–5.3)
Sodium: 153 mEq/L — ABNORMAL HIGH (ref 137–147)

## 2013-06-26 LAB — URINE CULTURE
Colony Count: NO GROWTH
Culture: NO GROWTH

## 2013-06-26 LAB — GLUCOSE, CAPILLARY
Glucose-Capillary: 137 mg/dL — ABNORMAL HIGH (ref 70–99)
Glucose-Capillary: 138 mg/dL — ABNORMAL HIGH (ref 70–99)
Glucose-Capillary: 158 mg/dL — ABNORMAL HIGH (ref 70–99)
Glucose-Capillary: 163 mg/dL — ABNORMAL HIGH (ref 70–99)

## 2013-06-26 MED ORDER — FUROSEMIDE 10 MG/ML IJ SOLN
20.0000 mg | Freq: Once | INTRAMUSCULAR | Status: AC
  Administered 2013-06-26: 20 mg via INTRAVENOUS

## 2013-06-26 MED ORDER — VANCOMYCIN HCL IN DEXTROSE 750-5 MG/150ML-% IV SOLN
750.0000 mg | INTRAVENOUS | Status: DC
Start: 2013-06-26 — End: 2013-06-27
  Administered 2013-06-26: 750 mg via INTRAVENOUS
  Filled 2013-06-26 (×2): qty 150

## 2013-06-26 MED ORDER — SODIUM CHLORIDE 0.9 % IV SOLN
2.0000 mg/h | INTRAVENOUS | Status: DC
  Administered 2013-06-26: 1 mg/h via INTRAVENOUS
  Administered 2013-06-28: 8.5 mg/h via INTRAVENOUS
  Administered 2013-06-28: 4 mg/h via INTRAVENOUS
  Administered 2013-06-29: 8.5 mg/h via INTRAVENOUS
  Filled 2013-06-26 (×5): qty 10

## 2013-06-26 MED ORDER — SODIUM CHLORIDE 0.9 % IV SOLN
1.5000 g | Freq: Two times a day (BID) | INTRAVENOUS | Status: DC
  Administered 2013-06-26 – 2013-06-27 (×2): 1.5 g via INTRAVENOUS
  Filled 2013-06-26 (×3): qty 1.5

## 2013-06-26 MED ORDER — MORPHINE SULFATE 2 MG/ML IJ SOLN
2.0000 mg | INTRAMUSCULAR | Status: DC | PRN
  Administered 2013-06-26 (×5): 2 mg via INTRAVENOUS
  Filled 2013-06-26 (×5): qty 1

## 2013-06-26 NOTE — Progress Notes (Addendum)
[facility] Progress Note [place] TEAM 1 - Stepdown/ICU TEAM   BRANDONLEE NAVIS UEA:[id number] DOB: Sep 04, 1923 DOA: 06/23/2013 PCP: Hannah Beat, MD  Brief narrative: PRATT BRESS is a 80 y.o. male has a past medical history significant for Alzheimer dementia with behavioral disturbances, multiple recent falls at home with several ED and PCP visits, HTN, hypothyroidism, presents to the ED with a chief complaint of fevers, confusion, breathing problems, decreased urination for the past week, with initial improvement at home but worsening within the past few days He was found to have pneumonia and tested Influenza A positive. Also, noted to be in significant renal failure.   Subjective: Pt still moderately agitated- noted to be more hypoxic with increased RR today.   Assessment/Plan: Principal Problem:   Acute respiratory failure/ Influenza with pneumonia - cont Tamiflu as tolerated - Obtained CXR which reveals increasing infiltrate on left- switch Ancef to Unasyn for possible aspiraion -  NT suctioning did not help O2 improve much - Discussed in detail with patient's family (wife and daughter) on 2 separate occasions today- They do not want to escalate to a full face mask. Start PRN Morphine for the respiratory distress. Continue DNR but also continue antibiotics for now to see if he will turn around in 24 hrs.    Sepsis/ Staph aureus bacteremia - blood cultures noted to be positive overnitght - Vanc and Ancef per ID- recommended total 4 wks - due to increasing infiltrate and the possibility he may be aspirating, will switch Ancef to Unasyn - repeat blood cx pending - TEE recommended but pt DNR and family was already considering oupt hospice, therefore, did not push for it-     Alzheimer's dementia - PRN Haldol  AKI/ acidosis - prerenal due to poor intake and diarrhea at home - significantly improved - cont to hydrate and hold ARB- does not appear to be fluid  overloaded  Urinary retention - previously was incontinent but noted to stop voiding  - required a foley to be placed on 1/3 with about 1500 cc of urine output    HYPOTHYROIDISM - IV Levothyroxine    ESSENTIAL HYPERTENSION - on IV Metoprolol with holding parameters  Hypernatremia and Hypoglycemia - cont D5   Thrombocytopenia -likely related to viral infection/ sepsis and has recovered-   Code Status: DNR Family Communication: with patient's wife and daughter Disposition Plan: currently very poor prognosis- if he survives, family would like to take him home with [place] hosp[facility]   Consultants: none  Procedures: none  Antibiotics: Vanc/ Zosyn 1/1- 1/2 Levaquin 1/2- 1/3 Vanc and Ancef 1/3  DVT prophylaxis: Heparin  Objective: Blood pressure 142/75, pulse 54, temperature 100.3 F (37.9 C), temperature source Axillary, resp. rate 28, height 5' 6.93" (1.7 m), weight 72.9 kg (160 lb 11.5 oz), SpO2 88.00%.  Intake/Output Summary (Last 24 hours) at 06/26/13 1623 Last data filed at 06/26/13 1300  Gross per 24 hour  Intake   1900 ml  Output   2150 ml  Net   -250 ml     Exam: General: moderate respiratory distress Lungs: significant rhonchi b/l - 88% on 6 L of O2- RR in 40s Cardiovascular: Regular rate and rhythm without murmur gallop- tachycardic Abdomen: Nontender, nondistended, soft, bowel sounds positive, no rebound, no ascites, no appreciable mass Extremities: No significant cyanosis, clubbing, or edema bilateral lower extremities  Data Reviewed: Basic Metabolic Panel:  Recent Labs Lab 06/23/13 1200 06/23/13 1239 06/23/13 1846 06/24/13 0443 06/25/13 0414 06/26/13 0800  NA  142 141 145 150* 155* 153*  K 4.0 3.8 3.3* 4.4 4.2 3.6*  CL 101 106 110 115* 120* 120*  CO2 17*  --  17* 18* 12* 16*  GLUCOSE 159* 157* 94 86 69* 150*  BUN 95* 95* 70* 69* 79* 54*  CREATININE 7.11* 7.40* 4.15* 3.56* 3.46* 1.80*  CALCIUM 8.9  --  7.5* 7.9* 8.5 8.2*  MG  --   --    --  2.5  --   --   PHOS  --   --   --  4.1  --   --    Liver Function Tests:  Recent Labs Lab 06/23/13 1200 06/24/13 0443  AST 118* 86*  ALT 71* 51  ALKPHOS 77 86  BILITOT 1.3* 1.4*  PROT 7.1 5.3*  ALBUMIN 3.4* 2.5*   No results found for this basename: LIPASE, AMYLASE,  in the last 168 hours No results found for this basename: AMMONIA,  in the last 168 hours CBC:  Recent Labs Lab 06/23/13 1200 06/23/13 1239 06/24/13 0443 06/26/13 0800  WBC 14.6*  --  12.7* 13.2*  NEUTROABS 13.5*  --   --   --   HGB 17.7* 17.7* 14.9 15.3  HCT 49.9 52.0 43.0 43.5  MCV 97.1  --  97.7 96.5  PLT 193  --  144* 181   Cardiac Enzymes: No results found for this basename: CKTOTAL, CKMB, CKMBINDEX, TROPONINI,  in the last 168 hours BNP (last 3 results) No results found for this basename: PROBNP,  in the last 8760 hours CBG:  Recent Labs Lab 06/25/13 1629 06/25/13 1926 06/26/13 0036 06/26/13 0530 06/26/13 1202  GLUCAP 103* 117* 137* 138* 158*    Recent Results (from the past 240 hour(s))  CULTURE, BLOOD (ROUTINE X 2)     Status: None   Collection Time    06/23/13 12:15 PM      Result Value Range Status   Specimen Description BLOOD RIGHT FOREARM   Final   Special Requests BOTTLES DRAWN AEROBIC AND ANAEROBIC 10MLS   Final   Culture  Setup Time     Final   Value: 06/24/2013 01:29     Performed at [facility]   Culture     Final   Value: STAPHYLOCOCCUS AUREUS     Note: RIFAMPIN AND GENTAMICIN SHOULD NOT BE USED AS SINGLE DRUGS FOR TREATMENT OF STAPH INFECTIONS.     Note: Gram Stain Report Called to,Read Back By and Verified With: WHITNEY DAVIS 06/25/13 @ 1:22PM BY RUSCOE A.     Performed at [facility]   Report Status PENDING   Incomplete  CULTURE, BLOOD (ROUTINE X 2)     Status: None   Collection Time    06/23/13 12:25 PM      Result Value Range Status   Specimen Description BLOOD LEFT ANTECUBITAL   Final   Special Requests BOTTLES DRAWN AEROBIC AND ANAEROBIC  10MLS   Final   Culture  Setup Time     Final   Value: 06/24/2013 01:28     Performed at [facility]   Culture     Final   Value: STAPHYLOCOCCUS AUREUS     Note: Gram Stain Report Called to,Read Back By and Verified With: Vivi MartensBRITTNEY MUSIAL [id number] BRMEL     Performed at [facility]   Report Status PENDING   Incomplete  URINE CULTURE     Status: None   Collection Time    06/23/13  1:15  PM      Result Value Range Status   Specimen Description URINE, CATHETERIZED   Final   Special Requests NONE   Final   Culture  Setup Time     Final   Value: 06/24/2013 01:33     Performed at [facility]   Colony Count     Final   Value: NO GROWTH     Performed at [facility]   Culture     Final   Value: NO GROWTH     Performed at [facility]   Report Status 06/24/2013 FINAL   Final  MRSA PCR SCREENING     Status: None   Collection Time    06/23/13  4:22 PM      Result Value Range Status   MRSA by PCR NEGATIVE  NEGATIVE Final   Comment:            The GeneXpert MRSA Assay (FDA     approved for NASAL specimens     only), is one component of a     comprehensive MRSA colonization     surveillance program. It is not     intended to diagnose MRSA     infection nor to guide or     monitor treatment for     MRSA infections.  CULTURE, BLOOD (ROUTINE X 2)     Status: None   Collection Time    06/25/13  1:25 PM      Result Value Range Status   Specimen Description BLOOD LEFT FOREARM   Final   Special Requests BOTTLES DRAWN AEROBIC AND ANAEROBIC 10CC   Final   Culture  Setup Time     Final   Value: 06/25/2013 20:38     Performed at [facility]   Culture     Final   Value:        BLOOD CULTURE RECEIVED NO GROWTH TO DATE CULTURE WILL BE HELD FOR 5 DAYS BEFORE ISSUING A FINAL NEGATIVE REPORT     Performed at [facility]   Report Status PENDING   Incomplete     Studies:  Recent x-ray studies have been reviewed in detail by the  Attending Physician  Scheduled Meds:  Scheduled Meds: .  ceFAZolin (ANCEF) IV  1 g Intravenous Q12H  . heparin subcutaneous  5,000 Units Subcutaneous Q8H  . levothyroxine  44 mcg Intravenous Daily  . metoprolol  5 mg Intravenous Q6H  . oseltamivir  30 mg Oral Daily  . tamsulosin  0.4 mg Oral Daily  . vancomycin  750 mg Intravenous Q24H   Continuous Infusions: . dextrose 100 mL/hr at 06/25/13 1610    Time spent on care of this patient: >45 min    Calvert Cantor, MD  [facility] Office  [phone] Pager - Text Page per Loretha Stapler as per below:  On-Call/Text Page:      [email or web address]      password [email or web address]  If 7PM-7AM, please contact night-coverage [email or web address] Password [email or web address] 06/26/2013, 4:23 PM   LOS: 3 days

## 2013-06-26 NOTE — Progress Notes (Signed)
Pt has been receiving 2 mg morphine every hour x 6 hours family asked about morphine gtt paged Tom callahan who ordered gtt  @ 1 will continue to monitor

## 2013-06-26 NOTE — Progress Notes (Signed)
Pt has become more tacypneac and grimmicing with a pain aid score of 4 Tom callahan called increased morphine to 2 mg

## 2013-06-26 NOTE — Progress Notes (Signed)
ANTIBIOTIC CONSULT NOTE - FOLLOW UP  Pharmacy Consult for  Vancomycin and Cefazolin-> change to Unasyn Indication: bacteremia - Staph aureus  No Known Allergies  Patient Measurements: Height: 5' 6.93" (170 cm) Weight: 160 lb 11.5 oz (72.9 kg) IBW/kg (Calculated) : 65.94  Vital Signs: Temp: 98.9 F (37.2 C) (01/04 1208) Temp src: Oral (01/04 1208) BP: 142/75 mmHg (01/04 1208) Pulse Rate: 54 (01/04 1208) Intake/Output from previous day: 01/03 0701 - 01/04 0700 In: 1400 [I.V.:1400] Out: 2850 [Urine:2850] Intake/Output from this shift: Total I/O In: 600 [I.V.:600] Out: 900 [Urine:900]  Labs:  Recent Labs  06/24/13 0443 06/25/13 0414 06/26/13 0800  WBC 12.7*  --  13.2*  HGB 14.9  --  15.3  PLT 144*  --  181  CREATININE 3.56* 3.46* 1.80*   Estimated Creatinine Clearance: 26.9 ml/min (by C-G formula based on Cr of 1.8).  Recent Labs  06/25/13 0555  VANCORANDOM 6.3    Assessment:   Day # 4 Vancomycin. Resumed with 1500 mg IV at 8am on 1/3. Intended Vanc 1 gram IV q48hr regimen to begin on 1/5, but renal function improving. Day # 2 Ancef. Off Levaquin 1/3.  Awaiting sensitivities of Staph aureus in blood.  Per ID, minimum of 4 weeks IV antibiotics planned.     Ancef now to change to Unasyn to broaden coverage for aspiration pneumonia.  Discussed briefly with Dr. Butler Denmarkizwan; she has discussed with Dr. Drue SecondSnider.  Goal of Therapy:  Vancomycin trough level 15-20 mcg/ml appropriate Cefazolin dose for renal function and infection  Plan:   Adjust Vancomycin to 750 mg IV q24hrs, begin today.  Ancef discontinued.  Last given ~10am today.  Begin Unasyn 1.5 grams IV q12hrs tonight.  Will follow up renal function for any need to further adjust regimens.  Follow up culture data.    Of note, day # 4 of 5 of Tamiflu, but has only received 1 dose to date (on 1/2), since unable to swallow capsules. Currently has stop date of 1/5, but may need to extend.  Dennie FettersEgan, Mckinley Olheiser Donovan,  RPh Pager: [phone] 06/26/2013,1:37 PM

## 2013-06-27 ENCOUNTER — Telehealth: Payer: Self-pay | Admitting: Family Medicine

## 2013-06-27 LAB — CULTURE, BLOOD (ROUTINE X 2)

## 2013-06-27 MED ORDER — CEFAZOLIN SODIUM 1-5 GM-% IV SOLN
1.0000 g | Freq: Two times a day (BID) | INTRAVENOUS | Status: DC
  Filled 2013-06-27: qty 50

## 2013-06-27 MED ORDER — VANCOMYCIN HCL IN DEXTROSE 750-5 MG/150ML-% IV SOLN
750.0000 mg | INTRAVENOUS | Status: DC
  Filled 2013-06-27: qty 150

## 2013-06-27 MED ORDER — MORPHINE SULFATE 2 MG/ML IJ SOLN: 2.0000 mg | INTRAMUSCULAR | Status: DC | PRN

## 2013-06-27 MED ORDER — WHITE PETROLATUM GEL
Status: DC | PRN
  Administered 2013-06-27: 0.2 via TOPICAL

## 2013-06-27 MED ORDER — LORAZEPAM 2 MG/ML IJ SOLN: 1.0000 mg | INTRAMUSCULAR | Status: DC | PRN

## 2013-06-27 MED ORDER — ATROPINE SULFATE 1 % OP SOLN
4.0000 [drp] | OPHTHALMIC | Status: DC | PRN
  Administered 2013-06-27 – 2013-06-28 (×5): 4 [drp] via SUBLINGUAL
  Filled 2013-06-27: qty 2

## 2013-06-27 MED ORDER — SODIUM CHLORIDE 0.9 % IV SOLN: INTRAVENOUS | Status: DC

## 2013-06-27 NOTE — Telephone Encounter (Signed)
Pt's wife called to let you know that [facility] and is not expected to live. He has double pneumonia, renal failure, along w/other complications.  She just wanted to make you aware of his situation. Thank you.

## 2013-06-27 NOTE — Progress Notes (Signed)
[facility] Progress Note [place] TEAM 1 - Stepdown/ICU TEAM   Leanord HawkingJames H Boggess UJW:[id number] DOB: 12-26-1923 DOA: 06/23/2013 PCP: Hannah BeatSpencer Copland, MD  Brief narrative: 80 y.o. male w/ history significant for Alzheimer dementia with behavioral disturbances, multiple recent falls at home with several ED and PCP visits, HTN, and hypothyroidism, who presented to the ED with a chief complaint of fevers, confusion, breathing problems, and decreased urination for a week.   He was found to have pneumonia and tested Influenza A positive. Also, he was noted to be in significant renal failure.   Subjective: Pt appears to be resting comfortably at present.   Assessment/Plan:  Acute respiratory failure / Influenza with MSSA bacterial pneumonia superinfection - Dr. Butler Denmarkizwan discussed in detail with patient's wife and daughter on 2 separate occasions 06/26/2013 - they did not want to escalate to a full face mask - Morphine gtt was initiated last PM for the respiratory distress - transition to full comfort care only as of today   Sepsis/ Staph aureus bacteremia - blood cultures noted to be positive - TEE recommended but pt DNR and family has now decided to transition to comfort focused care only  Alzheimer's dementia - PRN Haldol  AKI w/ acidosis - prerenal due to poor intake and diarrhea at home  Urinary retention - previously was incontinent but noted to stop voiding  - required a foley to be placed on 1/3 with about 1500 cc of urine output  HYPOTHYROIDISM  ESSENTIAL HYPERTENSION  Hypernatremia and Hypoglycemia - transition to comfort care only  Thrombocytopenia - likely related to viral infection / sepsis  Code Status: DNR Family Communication: discussed with patient's wife and daughter at bedside Disposition Plan: transition to comfort focused care - transfer to med bed - not yet clear if hospice home transfer will be indicated - follow  tradgectory  Consultants: ID  Procedures: none  Objective: Blood pressure 127/51, pulse 59, temperature 98.2 F (36.8 C), temperature source Axillary, resp. rate 9, height 5' 6.93" (1.7 m), weight 72.9 kg (160 lb 11.5 oz), SpO2 94.00%.  Intake/Output Summary (Last 24 hours) at 06/27/13 1311 Last data filed at 06/27/13 0800  Gross per 24 hour  Intake   1921 ml  Output   1050 ml  Net    871 ml   Exam: No evidence of resp distress - respirations are comfortable - HR controlled - pt is unresponsive  Data Reviewed: Basic Metabolic Panel:  Recent Labs Lab 06/23/13 1200 06/23/13 1239 06/23/13 1846 06/24/13 0443 06/25/13 0414 06/26/13 0800  NA 142 141 145 150* 155* 153*  K 4.0 3.8 3.3* 4.4 4.2 3.6*  CL 101 106 110 115* 120* 120*  CO2 17*  --  17* 18* 12* 16*  GLUCOSE 159* 157* 94 86 69* 150*  BUN 95* 95* 70* 69* 79* 54*  CREATININE 7.11* 7.40* 4.15* 3.56* 3.46* 1.80*  CALCIUM 8.9  --  7.5* 7.9* 8.5 8.2*  MG  --   --   --  2.5  --   --   PHOS  --   --   --  4.1  --   --    Liver Function Tests:  Recent Labs Lab 06/23/13 1200 06/24/13 0443  AST 118* 86*  ALT 71* 51  ALKPHOS 77 86  BILITOT 1.3* 1.4*  PROT 7.1 5.3*  ALBUMIN 3.4* 2.5*   CBC:  Recent Labs Lab 06/23/13 1200 06/23/13 1239 06/24/13 0443 06/26/13 0800  WBC 14.6*  --  12.7* 13.2*  NEUTROABS 13.5*  --   --   --   HGB 17.7* 17.7* 14.9 15.3  HCT 49.9 52.0 43.0 43.5  MCV 97.1  --  97.7 96.5  PLT 193  --  144* 181    Recent Results (from the past 240 hour(s))  CULTURE, BLOOD (ROUTINE X 2)     Status: None   Collection Time    06/23/13 12:15 PM      Result Value Range Status   Specimen Description BLOOD RIGHT FOREARM   Final   Special Requests BOTTLES DRAWN AEROBIC AND ANAEROBIC   Final   Culture  Setup Time     Final   Value: 06/24/2013 01:29     Performed at [facility]   Culture     Final   Value: STAPHYLOCOCCUS AUREUS     Note: SUSCEPTIBILITIES PERFORMED ON PREVIOUS  CULTURE WITHIN THE LAST 5 DAYS.     Note: Gram Stain Report Called to,Read Back By and Verified With: WHITNEY DAVIS 06/25/13 @ 1:22PM BY RUSCOE A.     Performed at [facility]   Report Status 06/27/2013 FINAL   Final  CULTURE, BLOOD (ROUTINE X 2)     Status: None   Collection Time    06/23/13 12:25 PM      Result Value Range Status   Specimen Description BLOOD LEFT ANTECUBITAL   Final   Special Requests BOTTLES DRAWN AEROBIC AND ANAEROBIC   Final   Culture  Setup Time     Final   Value: 06/24/2013 01:28     Performed at [facility]   Culture     Final   Value: STAPHYLOCOCCUS AUREUS     Note: RIFAMPIN AND GENTAMICIN SHOULD NOT BE USED AS SINGLE DRUGS FOR TREATMENT OF STAPH INFECTIONS.     Note: Gram Stain Report Called to,Read Back By and Verified With: Vivi Martens [id number] BRMEL     Performed at [facility]   Report Status 06/27/2013 FINAL   Final   Organism ID, Bacteria STAPHYLOCOCCUS AUREUS   Final  URINE CULTURE     Status: None   Collection Time    06/23/13  1:15 PM      Result Value Range Status   Specimen Description URINE, CATHETERIZED   Final   Special Requests NONE   Final   Culture  Setup Time     Final   Value: 06/24/2013 01:33     Performed at [facility]   Colony Count     Final   Value: NO GROWTH     Performed at [facility]   Culture     Final   Value: NO GROWTH     Performed at [facility]   Report Status 06/24/2013 FINAL   Final  MRSA PCR SCREENING     Status: None   Collection Time    06/23/13  4:22 PM      Result Value Range Status   MRSA by PCR NEGATIVE  NEGATIVE Final   Comment:            The GeneXpert MRSA Assay (FDA     approved for NASAL specimens     only), is one component of a     comprehensive MRSA colonization     surveillance program. It is not     intended to diagnose MRSA     infection nor to guide or     monitor treatment for  MRSA infections.  CULTURE, BLOOD  (ROUTINE X 2)     Status: None   Collection Time    06/25/13  1:10 PM      Result Value Range Status   Specimen Description BLOOD LEFT ARM   Final   Special Requests BOTTLES DRAWN AEROBIC AND ANAEROBIC 10CC   Final   Culture  Setup Time     Final   Value: 06/26/2013 13:47     Performed at [facility]   Culture     Final   Value:        BLOOD CULTURE RECEIVED NO GROWTH TO DATE CULTURE WILL BE HELD FOR 5 DAYS BEFORE ISSUING A FINAL NEGATIVE REPORT     Performed at [facility]   Report Status PENDING   Incomplete  CULTURE, BLOOD (ROUTINE X 2)     Status: None   Collection Time    06/25/13  1:25 PM      Result Value Range Status   Specimen Description BLOOD LEFT FOREARM   Final   Special Requests BOTTLES DRAWN AEROBIC AND ANAEROBIC 10CC   Final   Culture  Setup Time     Final   Value: 06/25/2013 20:38     Performed at [facility]   Culture     Final   Value:        BLOOD CULTURE RECEIVED NO GROWTH TO DATE CULTURE WILL BE HELD FOR 5 DAYS BEFORE ISSUING A FINAL NEGATIVE REPORT     Performed at [facility]   Report Status PENDING   Incomplete  URINE CULTURE     Status: None   Collection Time    06/25/13  3:47 PM      Result Value Range Status   Specimen Description URINE, CATHETERIZED   Final   Special Requests NONE   Final   Culture  Setup Time     Final   Value: 06/25/2013 20:56     Performed at [facility] Count     Final   Value: NO GROWTH     Performed at [facility]   Culture     Final   Value: NO GROWTH     Performed at [facility]   Report Status 06/26/2013 FINAL   Final     Studies:  Recent x-ray studies have been reviewed in detail by the Attending Physician  Scheduled Meds:  Scheduled Meds:   Continuous Infusions: . sodium chloride    . morphine 2 mg/hr (06/26/13 2315)    Time spent on care of this patient: 25 min    MCCLUNG,JEFFREY T, MD  [facility] Office   [phone] Pager - Text Page per Loretha Stapler as per below:  On-Call/Text Page:      [email or web address]      password [email or web address]  If 7PM-7AM, please contact night-coverage [email or web address] Password [email or web address] 06/27/2013, 1:11 PM   LOS: 4 days

## 2013-06-27 NOTE — Telephone Encounter (Signed)
i spoke with his wife a few hours ago. Understood.

## 2013-06-27 NOTE — Progress Notes (Addendum)
    [facility]    Date of Admission:  06/23/2013   Total days of antibiotics 5        Day 5 oseltamivir        Day 5 vanco        Day 2 amp/sub   ID: Dylan Roach is a 80 y.o. male with respiratory distress due to influenza c/b MSSA pneumonia and bacteremia  Principal Problem:   Acute respiratory failure Active Problems:   HYPOTHYROIDISM   ESSENTIAL HYPERTENSION   Alzheimer's dementia   Laceration of face   Bilateral pneumonia   Influenza with pneumonia   Staphylococcus aureus bacteremia    Subjective: Continues to deriorate, on morphine gtt  Medications:  . ampicillin-sulbactam (UNASYN) IV  1.5 g Intravenous Q12H  . heparin subcutaneous  5,000 Units Subcutaneous Q8H  . levothyroxine  44 mcg Intravenous Daily  . metoprolol  5 mg Intravenous Q6H  . oseltamivir  30 mg Oral Daily  . tamsulosin  0.4 mg Oral Daily    Objective: Vital signs in last 24 hours: Temp:  [98 F (36.7 C)-100.3 F (37.9 C)] 98 F (36.7 C) (01/05 0000) Pulse Rate:  [47-114] 49 (01/05 0801) Resp:  [10-34] 19 (01/05 0801) BP: (78-142)/(48-85) 125/85 mmHg (01/05 0801) SpO2:  [87 %-94 %] 91 % (01/05 0801) General: moderate respiratory distress  Lungs: significant rhonchi b/l - 88% on 6 L of O2- RR in 40s  Cardiovascular: Regular rate and rhythm without murmur gallop- Abdomen: Nontender, nondistended, soft, bowel sounds positive, no rebound, no ascites, no appreciable mass     Lab Results  Recent Labs  06/25/13 0414 06/26/13 0800  WBC  --  13.2*  HGB  --  15.3  HCT  --  43.5  NA 155* 153*  K 4.2 3.6*  CL 120* 120*  CO2 12* 16*  BUN 79* 54*  CREATININE 3.46* 1.80*    Microbiology: 06/25/12: blood cx: NGTD 06/25/12: urine cx: NGTD 06/23/12: blood cx: MSSA bacteremia  Studies/Results: Dg Chest Port 1 View  06/26/2013   CLINICAL DATA:  Hypoxia, cough fluid  EXAM: PORTABLE CHEST - 1 VIEW  COMPARISON:  Prior chest x-ray 06/23/2013  FINDINGS: Interval progression  of patchy airspace disease throughout the left lung. Mild airspace disease in the right lung base are similar compared to prior. Inspiratory volumes are lower. Cardiac and mediastinal contours are unchanged.  IMPRESSION: Progressive airspace disease throughout the right lung concerning for progression of multifocal pneumonia. The right basilar airspace disease remains relatively stable.   Electronically Signed   By: Malachy MoanHeath  McCullough M.D.   On: 06/26/2013 10:16    Assessment/Plan: mssa bacteremia likely due to 2nd infection/pna from influenza = currently on vanco and amp/sub, will change to cefazolin monotherpay to cover aspiration but likely just worsening of bacterial/viral infection.   Due to patient's family wishing for palliative care, no aggressive measures, will not pursue further management of staph aureas bacteremia.  Influenza = can stop oseltamivir today last dose.  Will sign off   Drue SecondSNIDER, [name] Cell: [phone] Pager: [phone]  06/27/2013, 10:51 AM

## 2013-06-27 NOTE — Progress Notes (Signed)
Utilization review completed.  

## 2013-06-27 NOTE — Progress Notes (Signed)
Nutrition Brief Note  Patient identified due to Low Braden Score   Wt Readings from Last 15 Encounters:  06/23/13 160 lb 11.5 oz (72.9 kg)  03/04/13 160 lb 12 oz (72.916 kg)  01/24/13 162 lb (73.483 kg)  01/05/13 162 lb 8 oz (73.71 kg)  12/21/12 163 lb 8 oz (74.163 kg)  10/07/12 165 lb (74.844 kg)  09/01/12 161 lb 8 oz (73.256 kg)  09/29/11 149 lb 1.9 oz (67.64 kg)  03/31/11 144 lb (65.318 kg)  01/01/11 140 lb 1.9 oz (63.558 kg)  11/28/10 144 lb 4 oz (65.431 kg)  10/10/10 153 lb 6.4 oz (69.582 kg)  04/18/10 158 lb (71.668 kg)  10/01/09 163 lb 12.8 oz (74.299 kg)   Per MD note, transitioning pt to full comfort care. Per SLP note, family not expecting pt to be able to eat but ice chips if desires.  No nutrition interventions warranted at this time. If nutrition issues arise, please consult RD.   Ian Malkineanne Barnett RD, LDN Inpatient Clinical Dietitian Pager: [phone] After Hours Pager: [phone]

## 2013-06-27 NOTE — Progress Notes (Signed)
Speech Language Pathology Discharge Patient Details Name: Dylan Roach MRN: [id number] DOB: Jul 16, 1923 Today's Date: 06/27/2013 Time: [id number]     Patient discharged from SLP services secondary to pt. not alert and family desiring ice if arouses.  Family states "we didn't expect him to be here at this point."  He is on morphine drip.  Family not expecting pt. To be able to eat but ice chips if desires.  Continue oral care and ST will sign off at this time.  Pls reconsult if needed  Please see latest therapy progress note for current level of functioning and progress toward goals.    Progress and discharge plan discussed with patient and/or caregiver: Patient/Caregiver agrees with plan  GO     Darrow BussingLisa Willis Yonael Tulloch M.Ed [employer] Pager [phone]

## 2013-06-28 MED ORDER — ACETAMINOPHEN 650 MG RE SUPP
650.0000 mg | RECTAL | Status: DC | PRN
  Administered 2013-06-28: 650 mg via RECTAL
  Filled 2013-06-28: qty 1

## 2013-06-28 NOTE — Progress Notes (Signed)
Report called to receiving RN on  6 north. Transferred to [id number] via bed with personal belongings. Family at bedside.   [facility] LLColly Tashona Calk,RN

## 2013-06-28 NOTE — Progress Notes (Signed)
[facility] Progress Note [place] TEAM 1 - Stepdown/ICU TEAM   Dylan Roach YQM:[id number] DOB: [birth year] DOA: 06/23/2013 PCP: Hannah Beat, MD  Brief narrative: 80 y.o. male w/ history significant for Alzheimer dementia with behavioral disturbances, multiple recent falls at home with several ED and PCP visits, HTN, and hypothyroidism, who presented to the ED with a chief complaint of fevers, confusion, breathing problems, and decreased urination for a week.   He was found to have pneumonia and tested Influenza A positive. Also, he was noted to be in significant renal failure.   Subjective: Pt appears to be resting comfortably at present.   Assessment/Plan:  Acute respiratory failure / Influenza with MSSA bacterial pneumonia superinfection -I spoke in detail with patient's wife and daughter on 2 separate occasions 06/26/2013 - they did not want to escalate to a full face mask - Morphine gtt was initiated on the evening on 1/4 for the respiratory distress - transition to full comfort care only on 1/5  Sepsis/ Staph aureus bacteremia - blood cultures noted to be positive - TEE recommended but pt DNR and family has now decided to transition to comfort focused care only  Alzheimer's dementia - PRN Haldol  AKI w/ acidosis - prerenal due to poor intake and diarrhea at home  Urinary retention - previously was incontinent but noted to stop voiding  - required a foley to be placed on 1/3 with about 1500 cc of urine output  HYPOTHYROIDISM  ESSENTIAL HYPERTENSION  Hypernatremia and Hypoglycemia - transition to comfort care only  Thrombocytopenia - likely related to viral infection / sepsis  Code Status: DNR Family Communication: discussed with patient's wife and daughter at bedside Disposition Plan: transition to comfort focused care - transfer to med bed - as he is close to passing now will not transfer to a Hospice home - family prefers not to move him -   Consultants: ID  Procedures: none  Objective: Blood pressure 124/51, pulse 124, temperature 98.9 F (37.2 C), temperature source Axillary, resp. rate 9, height 5' 6.93" (1.7 m), weight 72.9 kg (160 lb 11.5 oz), SpO2 94.00%.  Intake/Output Summary (Last 24 hours) at 06/28/13 1201 Last data filed at 06/28/13 1100  Gross per 24 hour  Intake 511.67 ml  Output    975 ml  Net -463.33 ml   Exam: General: irregular respirations- no distress- pt obtunded Lungs: significant rhonchi b/l - 94% on 6 L of O2- RR 9 Cardiovascular: Regular rate and rhythm without murmur gallop- tachycardic  Abdomen: Nontender, nondistended, soft, bowel sounds positive, no rebound, no ascites, no appreciable mass  Extremities: No significant cyanosis, clubbing, or edema bilateral lower extremities   Data Reviewed: Basic Metabolic Panel:  Recent Labs Lab 06/23/13 1200 06/23/13 1239 06/23/13 1846 06/24/13 0443 06/25/13 0414 06/26/13 0800  NA 142 141 145 150* 155* 153*  K 4.0 3.8 3.3* 4.4 4.2 3.6*  CL 101 106 110 115* 120* 120*  CO2 17*  --  17* 18* 12* 16*  GLUCOSE 159* 157* 94 86 69* 150*  BUN 95* 95* 70* 69* 79* 54*  CREATININE 7.11* 7.40* 4.15* 3.56* 3.46* 1.80*  CALCIUM 8.9  --  7.5* 7.9* 8.5 8.2*  MG  --   --   --  2.5  --   --   PHOS  --   --   --  4.1  --   --    Liver Function Tests:  Recent Labs Lab 06/23/13 1200 06/24/13 0443  AST 118* 86*  ALT 71* 51  ALKPHOS 77 86  BILITOT 1.3* 1.4*  PROT 7.1 5.3*  ALBUMIN 3.4* 2.5*   CBC:  Recent Labs Lab 06/23/13 1200 06/23/13 1239 06/24/13 0443 06/26/13 0800  WBC 14.6*  --  12.7* 13.2*  NEUTROABS 13.5*  --   --   --   HGB 17.7* 17.7* 14.9 15.3  HCT 49.9 52.0 43.0 43.5  MCV 97.1  --  97.7 96.5  PLT 193  --  144* 181    Recent Results (from the past 240 hour(s))  CULTURE, BLOOD (ROUTINE X 2)     Status: None   Collection Time    06/23/13 12:15 PM      Result Value Range Status   Specimen Description BLOOD RIGHT FOREARM    Final   Special Requests BOTTLES DRAWN AEROBIC AND ANAEROBIC 10MLS   Final   Culture  Setup Time     Final   Value: 06/24/2013 01:29     Performed at [facility]   Culture     Final   Value: STAPHYLOCOCCUS AUREUS     Note: SUSCEPTIBILITIES PERFORMED ON PREVIOUS CULTURE WITHIN THE LAST 5 DAYS.     Note: Gram Stain Report Called to,Read Back By and Verified With: WHITNEY DAVIS 06/25/13 @ 1:22PM BY RUSCOE A.     Performed at [facility]   Report Status 06/27/2013 FINAL   Final  CULTURE, BLOOD (ROUTINE X 2)     Status: None   Collection Time    06/23/13 12:25 PM      Result Value Range Status   Specimen Description BLOOD LEFT ANTECUBITAL   Final   Special Requests BOTTLES DRAWN AEROBIC AND ANAEROBIC 10MLS   Final   Culture  Setup Time     Final   Value: 06/24/2013 01:28     Performed at [facility]   Culture     Final   Value: STAPHYLOCOCCUS AUREUS     Note: RIFAMPIN AND GENTAMICIN SHOULD NOT BE USED AS SINGLE DRUGS FOR TREATMENT OF STAPH INFECTIONS.     Note: Gram Stain Report Called to,Read Back By and Verified With: Vivi MartensBRITTNEY MUSIAL [id number] BRMEL     Performed at [facility]   Report Status 06/27/2013 FINAL   Final   Organism ID, Bacteria STAPHYLOCOCCUS AUREUS   Final  URINE CULTURE     Status: None   Collection Time    06/23/13  1:15 PM      Result Value Range Status   Specimen Description URINE, CATHETERIZED   Final   Special Requests NONE   Final   Culture  Setup Time     Final   Value: 06/24/2013 01:33     Performed at [facility]   Colony Count     Final   Value: NO GROWTH     Performed at [facility]   Culture     Final   Value: NO GROWTH     Performed at [facility]   Report Status 06/24/2013 FINAL   Final  MRSA PCR SCREENING     Status: None   Collection Time    06/23/13  4:22 PM      Result Value Range Status   MRSA by PCR NEGATIVE  NEGATIVE Final   Comment:            The GeneXpert MRSA Assay  (FDA     approved for NASAL specimens     only), is one  component of a     comprehensive MRSA colonization     surveillance program. It is not     intended to diagnose MRSA     infection nor to guide or     monitor treatment for     MRSA infections.  CULTURE, BLOOD (ROUTINE X 2)     Status: None   Collection Time    06/25/13  1:10 PM      Result Value Range Status   Specimen Description BLOOD LEFT ARM   Final   Special Requests BOTTLES DRAWN AEROBIC AND ANAEROBIC 10CC   Final   Culture  Setup Time     Final   Value: 06/26/2013 13:47     Performed at [facility]   Culture     Final   Value:        BLOOD CULTURE RECEIVED NO GROWTH TO DATE CULTURE WILL BE HELD FOR 5 DAYS BEFORE ISSUING A FINAL NEGATIVE REPORT     Performed at [facility]   Report Status PENDING   Incomplete  CULTURE, BLOOD (ROUTINE X 2)     Status: None   Collection Time    06/25/13  1:25 PM      Result Value Range Status   Specimen Description BLOOD LEFT FOREARM   Final   Special Requests BOTTLES DRAWN AEROBIC AND ANAEROBIC 10CC   Final   Culture  Setup Time     Final   Value: 06/25/2013 20:38     Performed at [facility]   Culture     Final   Value:        BLOOD CULTURE RECEIVED NO GROWTH TO DATE CULTURE WILL BE HELD FOR 5 DAYS BEFORE ISSUING A FINAL NEGATIVE REPORT     Performed at [facility]   Report Status PENDING   Incomplete  URINE CULTURE     Status: None   Collection Time    06/25/13  3:47 PM      Result Value Range Status   Specimen Description URINE, CATHETERIZED   Final   Special Requests NONE   Final   Culture  Setup Time     Final   Value: 06/25/2013 20:56     Performed at [facility] Count     Final   Value: NO GROWTH     Performed at [facility]   Culture     Final   Value: NO GROWTH     Performed at [facility]   Report Status 06/26/2013 FINAL   Final     Studies:  Recent x-ray studies have been reviewed in  detail by the Attending Physician  Scheduled Meds:  Scheduled Meds:   Continuous Infusions: . sodium chloride 20 mL/hr at 06/27/13 1401  . morphine 4 mg/hr (06/28/13 1100)    Time spent on care of this patient: 25 min    Calvert Cantor, MD  [facility] Office  [phone] Pager - Text Page per Loretha Stapler as per below:  On-Call/Text Page:      [email or web address]      password [email or web address]  If 7PM-7AM, please contact night-coverage [email or web address] Password [email or web address] 06/28/2013, 12:01 PM   LOS: 5 days

## 2013-06-29 ENCOUNTER — Inpatient Hospital Stay (HOSPITAL_COMMUNITY)
Admission: AD | Admit: 2013-06-29 | Discharge: 2013-07-24 | DRG: 193 | Disposition: E | Source: Ambulatory Visit | Attending: Internal Medicine | Admitting: Internal Medicine

## 2013-06-29 ENCOUNTER — Ambulatory Visit: Payer: Medicare Other | Admitting: Family Medicine

## 2013-06-29 DIAGNOSIS — I10 Essential (primary) hypertension: Secondary | ICD-10-CM | POA: Diagnosis present

## 2013-06-29 DIAGNOSIS — J9600 Acute respiratory failure, unspecified whether with hypoxia or hypercapnia: Secondary | ICD-10-CM | POA: Diagnosis present

## 2013-06-29 DIAGNOSIS — J111 Influenza due to unidentified influenza virus with other respiratory manifestations: Secondary | ICD-10-CM | POA: Diagnosis present

## 2013-06-29 DIAGNOSIS — G309 Alzheimer's disease, unspecified: Secondary | ICD-10-CM | POA: Diagnosis present

## 2013-06-29 DIAGNOSIS — T424X5A Adverse effect of benzodiazepines, initial encounter: Secondary | ICD-10-CM | POA: Diagnosis not present

## 2013-06-29 DIAGNOSIS — J189 Pneumonia, unspecified organism: Principal | ICD-10-CM | POA: Diagnosis present

## 2013-06-29 DIAGNOSIS — E039 Hypothyroidism, unspecified: Secondary | ICD-10-CM | POA: Diagnosis present

## 2013-06-29 DIAGNOSIS — Z515 Encounter for palliative care: Secondary | ICD-10-CM

## 2013-06-29 DIAGNOSIS — A419 Sepsis, unspecified organism: Secondary | ICD-10-CM | POA: Diagnosis present

## 2013-06-29 DIAGNOSIS — B958 Unspecified staphylococcus as the cause of diseases classified elsewhere: Secondary | ICD-10-CM | POA: Diagnosis present

## 2013-06-29 DIAGNOSIS — R0902 Hypoxemia: Secondary | ICD-10-CM

## 2013-06-29 DIAGNOSIS — F0280 Dementia in other diseases classified elsewhere without behavioral disturbance: Secondary | ICD-10-CM | POA: Diagnosis present

## 2013-06-29 DIAGNOSIS — A412 Sepsis due to unspecified staphylococcus: Secondary | ICD-10-CM | POA: Diagnosis present

## 2013-06-29 DIAGNOSIS — R7881 Bacteremia: Secondary | ICD-10-CM

## 2013-06-29 DIAGNOSIS — E860 Dehydration: Secondary | ICD-10-CM

## 2013-06-29 DIAGNOSIS — Y9210 Unspecified residential institution as the place of occurrence of the external cause: Secondary | ICD-10-CM | POA: Diagnosis not present

## 2013-06-29 DIAGNOSIS — A4901 Methicillin susceptible Staphylococcus aureus infection, unspecified site: Secondary | ICD-10-CM

## 2013-06-29 DIAGNOSIS — Z66 Do not resuscitate: Secondary | ICD-10-CM | POA: Diagnosis present

## 2013-06-29 DIAGNOSIS — IMO0002 Reserved for concepts with insufficient information to code with codable children: Secondary | ICD-10-CM | POA: Diagnosis not present

## 2013-06-29 DIAGNOSIS — J1100 Influenza due to unidentified influenza virus with unspecified type of pneumonia: Secondary | ICD-10-CM

## 2013-06-29 MED ORDER — ACETAMINOPHEN 650 MG RE SUPP
650.0000 mg | Freq: Four times a day (QID) | RECTAL | Status: DC | PRN
Start: 2013-06-29 — End: 2013-06-30

## 2013-06-29 MED ORDER — LORAZEPAM 2 MG/ML IJ SOLN
0.5000 mg | INTRAMUSCULAR | Status: DC | PRN
  Filled 2013-06-29: qty 1

## 2013-06-29 MED ORDER — ATROPINE SULFATE 1 % OP SOLN: 4.0000 [drp] | OPHTHALMIC | Status: DC | PRN

## 2013-06-29 MED ORDER — MORPHINE SULFATE 10 MG/ML IJ SOLN
1.0000 mg/h | INTRAMUSCULAR | Status: DC
  Administered 2013-06-30: 1 mg/h via INTRAVENOUS
  Filled 2013-06-29 (×2): qty 10

## 2013-06-29 MED ORDER — SCOPOLAMINE 1 MG/3DAYS TD PT72
1.0000 | MEDICATED_PATCH | TRANSDERMAL | Status: DC
  Administered 2013-06-29: 1.5 mg via TRANSDERMAL
  Filled 2013-06-29: qty 1

## 2013-06-29 MED ORDER — ACETAMINOPHEN 325 MG PO TABS: 650.0000 mg | ORAL_TABLET | Freq: Four times a day (QID) | ORAL | Status: DC | PRN

## 2013-06-29 MED ORDER — DEXTROSE 5 % IV SOLN
1.0000 mg/h | INTRAVENOUS | Status: DC
  Filled 2013-06-29 (×2): qty 10

## 2013-06-29 MED ORDER — SODIUM CHLORIDE 0.9 % IV SOLN: 1.0000 mg/h | INTRAVENOUS | Status: DC

## 2013-06-29 NOTE — H&P (Signed)
[facility] History and Physical  Dylan Roach ZOX:[id number] DOB: 26-Mar-1924 DOA: 07/22/2013  Referring physician: Dr. Isidoro Roach PCP: Dylan Beat, MD   Chief Complaint: Respiratory  Failure secondary to flu and Staph bacteremia   History of Present Illness: Dylan Roach is an 80 y.o. male  Presented with altered mental status and respiratory distress.  Patient has known history of advanced dementia.  Family reports Flu shot 4 days prior to illness.  Patient found to be infected with Influenza with finding of Staph bacteremia  Review of Systems: Unable to be obtained due to altered mental status  Past Medical History Past Medical History  Diagnosis Date  . Arthritis   . Alzheimer's dementia with behavioral disturbance   . Hypertension   . Hypothyroid      Past Surgical History Past Surgical History  Procedure Laterality Date  . Reconstruction medial collateral ligament elbow w/ tendon graft       Social History: History   Social History  . Marital Status: Married    Spouse Name: N/A    Number of Children: N/A  . Years of Education: N/A   Occupational History  . retired    Social History Main Topics  . Smoking status: Never Smoker   . Smokeless tobacco: Never Used  . Alcohol Use: No  . Drug Use: No  . Sexual Activity: Not on file   Other Topics Concern  . Not on file   Social History Narrative  . 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  Meds: Prior to Admission medications   Not on File      Physical Exam:  Gen: No acute distress.Breathing slightly apneaic Head: Normocephalic, atraumatic Eyes: Pupils not examined, eyes closed, mm dry Mouth: Oropharynx dry Chest: Lungs decreased no RRW CV: Heart sounds regular rate and rhythm,S1, S2 Abdomen: Soft, nontender, nondistended with normal active bowel sounds. Extremities: Extremities  disseminated pustular rash Skin: Warm and dry. Neuro: not rousable to tactile or verbal stimuli Labs on Admission:  Basic Metabolic Panel:  Recent Labs Lab 06/23/13 1200 06/23/13 1239 06/23/13 1846 06/24/13 0443 06/25/13 0414 06/26/13 0800  NA 142 141 145 150* 155* 153*  K 4.0 3.8 3.3* 4.4 4.2 3.6*  CL 101 106 110 115* 120* 120*  CO2 17*  --  17* 18* 12* 16*  GLUCOSE 159* 157* 94 86 69* 150*  BUN 95* 95* 70* 69* 79* 54*  CREATININE 7.11* 7.40* 4.15* 3.56* 3.46* 1.80*  CALCIUM 8.9  --  7.5* 7.9* 8.5 8.2*  MG  --   --   --  2.5  --   --   PHOS  --   --   --  4.1  --   --    Liver Function Tests:  Recent Labs Lab 06/23/13 1200 06/24/13 0443  AST 118* 86*  ALT 71* 51  ALKPHOS 77 86  BILITOT 1.3* 1.4*  PROT 7.1 5.3*  ALBUMIN 3.4* 2.5*     CBC:  Recent Labs Lab 06/23/13 1200 06/23/13 1239 06/24/13 0443 06/26/13 0800  WBC 14.6*  --  12.7* 13.2*  NEUTROABS 13.5*  --   --   --   HGB 17.7* 17.7* 14.9 15.3  HCT 49.9 52.0 43.0 43.5  MCV 97.1  --  97.7 96.5  PLT 193  --  144* 181     CBG:  Recent Labs Lab 06/25/13 1926 06/26/13  0036 06/26/13 0530 06/26/13 1202 06/26/13 1627  GLUCAP 117* 137* 138* 158* 163*      Assessment/Plan Active Problems:   Alzheimer disease   Code Status:  DNR Family Communication:  Daughter, Granddaughter and significant other at the bedside Disposition Plan:  Hospital Death  Time spent:  400 pm- 445 pm  Dylan Roach L. Ladona Ridgelaylor, MD MBA [facility] Palliative [facility] at [facility] Team Phone: [phone] Pager: [phone]

## 2013-06-29 NOTE — Progress Notes (Addendum)
Room 6N21 - [place] [facility] RN Visit-Dylan Hunn RN  GIP STATUS admission - [facility] diagnosis Alzheimers.   Pt is DNR code.  Pt obtunded with open mouthed breathing,  lying in bed, appears comfortable.  Wife, dtr and granddtr present.  Family aware of patients declining status, states pt's wife is weary as she has been with pt in hospital as well as cared for pt with his dementia and behavior disturbances over the past couple years.    Please call [facility] @ [phone]- ask for RN Liaison or after hours,ask for on-call RN to advise of patient's death..   Thank you.  Joneen Boersose B Rosabella Edgin, RN  [facility]  Hospice Liaison  [phone])

## 2013-06-29 NOTE — Care Management Note (Signed)
  Page 2 of 2   06/28/2013     11:19:23 AM   CARE MANAGEMENT NOTE 06/23/2013  Patient:  Dylan Roach,Dylan Roach   Account Number:  [id number]  Date Initiated:  06/27/2013  Documentation initiated by:  Donn PieriniWEBSTER,KRISTI  Subjective/Objective Assessment:   Pt admitted with bil pna / sepsis - ARF     Action/Plan:   PTA pt lived at home with spouse   Anticipated DC Date:  August 19, 2013   Anticipated DC Plan:        DC Planning Services  CM consult      Choice offered to / List presented to:             Status of service:  In process, will continue to follow Medicare Important Message given?   (If response is "NO", the following Medicare IM given date fields will be blank) Date Medicare IM given:   Date Additional Medicare IM given:    Discharge Disposition:    Per UR Regulation:  Reviewed for med. necessity/level of care/duration of stay  If discussed at Long Length of Stay Meetings, dates discussed:   06/28/2013    Comments:  07/08/2013 Discussed GIP with patient's wife and daughter , offered choice . They would like to speak [facility] someone [facility] .  [facility] . Ronny FlurryHeather Peyton Spengler RN BSN [phone]   07/04/2013 Received referral for GIP eval from Dr Isidoro Donningai . Left voice mail for Forrestine Himva Davis . Ronny FlurryHeather Ella Guillotte RN BSN [phone]    06/28/13- 1200- Donn PieriniKristi Webster RN, BSN [phone] Approached Dr. Butler Denmarkizwan [facility]- at this time MD feels like pt is actively dying and does not wish to pursue a referral to GIP- will reassess tomorrow if pt survives the next 24 hr.  06/27/13- 1400- Donn PieriniKristi Webster RN, BSN [phone] transition to comfort focused care - morphine gtt - transfer to med bed - not yet clear if hospice home transfer will be indicated vs GIP - (follow tradgectory per MD note)

## 2013-06-29 NOTE — Discharge Summary (Signed)
Physician Discharge Summary  Patient ID: Dylan Roach MRN: [id number] DOB/AGE: [birth year] 80 y.o.  Admit date: 06/23/2013 Discharge date: July 16, 2013  Primary Care Physician:  Hannah Beat, MD  Discharge Diagnoses:   . Staphylococcus aureus bacteremia/severe sepsis . Bilateral pneumonia . Alzheimer's dementia . HYPOTHYROIDISM . ESSENTIAL HYPERTENSION . Acute respiratory failure . Influenza A positive with pneumonia . Staphylococcus aureus bacteremia Acute renal failure with multiorgan dysfunction  Consults: Infectious disease, Dr. Ilsa Iha                   Palliative care/hospice   Allergies:  No Known Allergies   Discharge Medications:   Medication List    STOP taking these medications       aspirin 81 MG EC tablet     citalopram 20 MG tablet  Commonly known as:  CELEXA     levothyroxine 88 MCG tablet  Commonly known as:  SYNTHROID, LEVOTHROID     losartan 100 MG tablet  Commonly known as:  COZAAR     OVER THE COUNTER MEDICATION     QUEtiapine 100 MG tablet  Commonly known as:  SEROQUEL     tamsulosin 0.4 MG Caps capsule  Commonly known as:  FLOMAX     vitamin B-12 500 MCG tablet  Commonly known as:  CYANOCOBALAMIN     vitamin D (CHOLECALCIFEROL) 400 UNITS tablet         Brief H and P: For complete details please refer to admission H and P, but in brief the patient is an 80 year old male with past medical history is significant for Alzheimer's dementia with behavioral disturbance, multiple recent falls, hypertension, hypothyroidism presented to ER with fevers, confusion, shortness of breath, decreased urination for the past one week prior to admission. In the ER patient was found to have renal failure with lactic acidosis, leukocytosis and chest x-ray showing multifocal pneumonia. Patient was admitted for further workup.  Hospital Course:  Dylan Roach is a 80 y.o. male has a past medical history significant for Alzheimer dementia with  behavioral disturbances, multiple recent falls at home with several ED and PCP visits, HTN, hypothyroidism, presents to the ED with shortness of breath, fevers and chills, oliguria. He was in respiratory distress and found to have pneumonia and tested Influenza A positive. Also, noted to be in significant renal failure.  Acute respiratory failure / Influenza with MSSA bacterial pneumonia superinfection : Infectious disease was consulted, patient was followed by Dr. Ilsa Iha. Patient was initially started on IV antibiotics admitted to step down unit. Dr Butler Denmark spoke in detail with patient's wife and daughter on 2 separate occasions 06/26/2013 - they did not want to escalate to a full face mask - Morphine gtt was initiated on the evening on 1/4 for the respiratory distress - transition to full comfort care only on 1/5. Patient was transferred to the regular floor on 06/28/13. Currently patient is on morphine drip with full comfort care goals. Sepsis/ Staph aureus bacteremia  - blood cultures noted to be positive  - TEE recommended but pt DNR and family has now decided to transition to comfort focused care only  Alzheimer's dementia  - PRN Haldol  AKI w/ acidosis  - prerenal due to poor intake and diarrhea at home  Urinary retention  - previously was incontinent but noted to stop voiding  - required a foley to be placed on 1/3 with about 1500 cc of urine output  HYPOTHYROIDISM  ESSENTIAL HYPERTENSION  Hypernatremia and Hypoglycemia  - transition  to comfort care only  Thrombocytopenia  - likely related to viral infection / sepsis   Patient will be discharged to inpatient hospice, under Dr. Derenda MisMelissa Taylor who will assume care.  Day of Discharge BP 109/49  Pulse 93  Temp(Src) 99.2 F (37.3 C) (Axillary)  Resp 12  Ht 5' 6.93" (1.7 m)  Wt 72.9 kg (160 lb 11.5 oz)  BMI 25.22 kg/m2  SpO2 96%  Physical Exam: General: Unresponsive  CVS: S1-S2 clear  Chest: Diffuse rhonchi bilaterally Abdomen:  soft normal bowel sounds Extremities: no edema    The results of significant diagnostics from this hospitalization (including imaging, microbiology, ancillary and laboratory) are listed below for reference.    LAB RESULTS: Basic Metabolic Panel:  Recent Labs Lab 06/24/13 0443 06/25/13 0414 06/26/13 0800  NA 150* 155* 153*  K 4.4 4.2 3.6*  CL 115* 120* 120*  CO2 18* 12* 16*  GLUCOSE 86 69* 150*  BUN 69* 79* 54*  CREATININE 3.56* 3.46* 1.80*  CALCIUM 7.9* 8.5 8.2*  MG 2.5  --   --   PHOS 4.1  --   --    Liver Function Tests:  Recent Labs Lab 06/23/13 1200 06/24/13 0443  AST 118* 86*  ALT 71* 51  ALKPHOS 77 86  BILITOT 1.3* 1.4*  PROT 7.1 5.3*  ALBUMIN 3.4* 2.5*   No results found for this basename: LIPASE, AMYLASE,  in the last 168 hours No results found for this basename: AMMONIA,  in the last 168 hours CBC:  Recent Labs Lab 06/23/13 1200  06/24/13 0443 06/26/13 0800  WBC 14.6*  --  12.7* 13.2*  NEUTROABS 13.5*  --   --   --   HGB 17.7*  < > 14.9 15.3  HCT 49.9  < > 43.0 43.5  MCV 97.1  --  97.7 96.5  PLT 193  --  144* 181  < > = values in this interval not displayed. Cardiac Enzymes: No results found for this basename: CKTOTAL, CKMB, CKMBINDEX, TROPONINI,  in the last 168 hours BNP: No components found with this basename: POCBNP,  CBG:  Recent Labs Lab 06/26/13 1202 06/26/13 1627  GLUCAP 158* 163*    Significant Diagnostic Studies:  Dg Chest Port 1 View  06/23/2013   CLINICAL DATA:  Fever, cough and congestion with tachycardia.  EXAM: PORTABLE CHEST - 1 VIEW  COMPARISON:  11/03/2003  FINDINGS: Lungs are somewhat hypoinflated with patchy airspace opacification over the left mid to lower lung and right base suggesting a pneumonia. No definite effusion. Cardiomediastinal silhouette is unremarkable. There is mild degenerative change of the spine and suggestion of an old left lateral 5th rib fracture.  IMPRESSION: Patchy airspace process over the left  mid to lower lung and right base suggesting a multifocal pneumonia.   Electronically Signed   By: Elberta Fortisaniel  Boyle M.D.   On: 06/23/2013 12:39       Disposition and Follow-up:    DISPOSITION: Inpatient hospice, comfort care goals DIET: Comfort feeds   Time spent on Discharge: 35 mins  Signed:   Lorriann Hansmann M.D. [facility] 07/09/2013, 3:05 PM Pager: [phone]

## 2013-06-29 NOTE — H&P (Signed)
Patient RU:EAVWU:Dylan Roach      DOB: [birth year]      JWJ:[id number]  Patient seen and examined, full H and P to follow:  Family at the bedside- daughter, granddaughter, and Dylan Roach   Patient unresponsive to tactile and verbal stimuli.  Currently, on morphine drip.  Repositioned head on palliative pillow.  No significant respiratory distress.  Orders entered to continue comfort care.   Time 400 pm - 445 pm  Discussed with Hospice team And Dr. Gerrit Friendsai   Dylan Rajewski L. Ladona Ridgelaylor, MD MBA The Palliative Medicine Team at [facility] Team Phone: [phone] Pager: [phone]

## 2013-06-30 MED ORDER — HALOPERIDOL LACTATE 5 MG/ML IJ SOLN
2.0000 mg | Freq: Four times a day (QID) | INTRAMUSCULAR | Status: DC | PRN
  Administered 2013-06-30: 2 mg via INTRAVENOUS
  Filled 2013-06-30: qty 1

## 2013-06-30 MED ORDER — MORPHINE BOLUS VIA INFUSION
1.0000 mg | INTRAVENOUS | Status: DC | PRN
Start: 2013-06-30 — End: 2013-06-30
  Filled 2013-06-30: qty 1

## 2013-07-01 ENCOUNTER — Telehealth: Payer: Self-pay | Admitting: Family Medicine

## 2013-07-01 LAB — CULTURE, BLOOD (ROUTINE X 2): CULTURE: NO GROWTH

## 2013-07-01 NOTE — Telephone Encounter (Signed)
Dr.Taylor called to let you know patient passed away yesterday.  Dr.Taylor said if you have any questions,call her.

## 2013-07-01 NOTE — Telephone Encounter (Signed)
All known and noted.

## 2013-07-02 LAB — CULTURE, BLOOD (ROUTINE X 2): Culture: NO GROWTH

## 2013-07-24 NOTE — Progress Notes (Signed)
Inpatient Room 6N21  [facility] RN Visit- M. Konrad DoloresLester, RN  GIP Status admission Hospice Dx: Alzheimers;Pt is a DNR  Pt seen at bedside non-responsive to voice or touch, pale, noted increased WOB with 3-5 second pauses RR= 22 O2 sat 86% on 6LNC, on continuous Morphine gtt @ 8.5mg /hr  Spoke with staff RN Ladona Ridgelaylor, Morphine drip increased to 9.5mg /hr and will contact Dr Judie PetitM. Ladona Ridgelaylor to obtain prn bolus dose and discuss additional comfort measures as daughter and wife indicated pt has responded well to Haldol in past and Ativan causes agitation. Wife, daughter and granddaughter at bedside tearful, discussed s/sx EOL and breathing changes being seen emotional support offered  Please call [facility] @ [phone]- ask for RN Liaison or after hours,ask for on-call RN with any hospice needs.   Thank you.  Valente DavidMargie Navy Rothschild, RN  [facility]  Hospice Liaison  [phone])

## 2013-07-24 NOTE — Progress Notes (Signed)
Nutrition Brief Note  Patient identified due to Low Braden Score  Wt Readings from Last 15 Encounters:  06/23/13 160 lb 11.5 oz (72.9 kg)  03/04/13 160 lb 12 oz (72.916 kg)  01/24/13 162 lb (73.483 kg)  01/05/13 162 lb 8 oz (73.71 kg)  12/21/12 163 lb 8 oz (74.163 kg)  10/07/12 165 lb (74.844 kg)  09/01/12 161 lb 8 oz (73.256 kg)  09/29/11 149 lb 1.9 oz (67.64 kg)  03/31/11 144 lb (65.318 kg)  01/01/11 140 lb 1.9 oz (63.558 kg)  11/28/10 144 lb 4 oz (65.431 kg)  10/10/10 153 lb 6.4 oz (69.582 kg)  04/18/10 158 lb (71.668 kg)  10/01/09 163 lb 12.8 oz (74.299 kg)   Pt is hospice pt with plans to continue comfort care. No diet order. Per MD note from previous admission pt can have ice chips if he wants anything PO.   No nutrition interventions warranted at this time. If nutrition issues arise, please consult RD.   Dylan Roach RD, LDN Inpatient Clinical Dietitian Pager: [phone] After Hours Pager: [phone]

## 2013-07-24 NOTE — Progress Notes (Signed)
Patient JX:BJYNW:Dylan Roach      DOB: 1924-06-13      GNF:[id number]   Palliative Medicine Team at [facility] Progress Note    Subjective: Patient with episode of increased work of breathing.  Increasing pauses in breathing.  Spoke with nurse to clarify bolus order and drip order.  Arrived to find patient with improved work of breathing but new changes getting closer to his time of death.  Secretions controlled.  He is not showing signs of distress. Family admits to being stressed and "not having a good day".  We talked about our collective experience with long end of life processes.  Family feels symptoms have improved with medication adjustments.  I empowered them to share their concerns and honored their observation that ativan had a paradoxical effect     Filed Vitals:   2013-12-15 0504  BP: 83/34  Pulse: 60  Temp: 100 F (37.8 C)  Resp: 10   Physical exam:  General: no brow furrowing, regular deep breathing with pauses Pupils not examined Chest decreased but clear anteriorly CVS : regular , S1, S2 Abd: soft, not tender, not distended Ext: some new mottling, cool Neuro: not responsive to tactile or verbal stimuli   Assessment and plan: 80 yr old white male suffering from Influenza with Staph bacteremia and sepsis in the face of advanced directive.  1.  DNR   2.  Dyspnea:  Continue morphine drip no signs of myoclonus drip increased to 9 mg/hr  3.  Agitation: family shared that patient had a paradoxical response to ativan.  Changed to haldol 2 mg q 6 prn   Total time :  25 min 1005-1030 am   Zair Borawski L. Ladona Ridgelaylor, MD MBA The Palliative Medicine Team at [facility] Team Phone: [phone] Pager: [phone]

## 2013-07-24 NOTE — Progress Notes (Signed)
Pt. Family called me in to inform me pt. Had passed.  Assessed pt., no pulse or breath sounds.  Second opinion from McKessonKelly Corum RN.  Time of death at [id number].  Notified MD. Dylan Roach, Dylan Roach

## 2013-07-24 NOTE — Progress Notes (Signed)
[facility] Social Work Visit: Met with patient's spouse, daughter and grand daughter at bedside. All report having "better afternoon" than morning and being very pleased with how symptoms are managed at this time. They shared how pleased they have been with care during hospitalization from ED to 6N. All three shared caregiver stories and laughed about patient's "antics." Affirmed their devotion,  care givng efforts and what they described as their "team approach" to caring for Dylan Roach for many years. They expressed strong faith and family values, showed their sense of humor and shared how they have supported each other through years of care giving. They report good support from their church family in [place]. They have made arrangements with [facility] in [place]. They are aware of [facility] bereavement support follow up. Please do not hesitate to contact me for support to this family. Thank you. Dylan Conte LCSW [phone]

## 2013-07-24 NOTE — Progress Notes (Signed)
Called [facility], spoke with Milas GainBrent Morin.  Pt. Not potential donor.   Vanice Sarahhompson, Lorne Winkels L

## 2013-07-24 NOTE — Progress Notes (Signed)
Wasted 60cc morhpine drip in sink with Elyn PeersBettie Jo Tilman, RN Vanice Sarahhompson, Jahsir Rama L

## 2013-07-24 NOTE — Discharge Summary (Signed)
Death Summary  Leanord HawkingJames H Fontanez ZOX:[id number] DOB: 02/17/1924 DOA: 06/23/2013  PCP: Hannah BeatSpencer Copland, MD PCP/Office notified: Will attempt to locate and update PCP Admit date: 07/19/2013 Date of Death: 07/15/2013  Final Diagnoses:  Active Problems:   Alzheimer disease   History of present illness: 80 yr old white male admitted as General Inpatient under Hospice care after being admitted to the hospital with bilateral pneumonia, Influenza A, and Staph bacteremia.     Hospital Course:  Patient was initially treated for the above diagnosis but his condition worsened and his family elected to transition him to comfort care.  He was discharged and readmitted as GIP level of care on a morphine drip.  His agitation was treated with haldol as his family related that he has had a paradoxical reaction to ativan. Patient became more agitated this am and his medications were further titrated.  He was found to have no pulse or respirations at 423 pm and was pronounced by nursing. Emotional support was offered by the hospice team.   Time: 423 pm  Signed:  Arionne Iams L. Ladona Ridgelaylor, MD MBA The Palliative Medicine Team at [facility] Team Phone: [phone] Pager: [phone]

## 2013-07-24 DEATH — deceased

## 2014-07-27 IMAGING — CR DG CHEST 1V PORT
1 series · 1 of 1 positions shown · non-contrast
Comparison: Prior chest x-ray 06/23/2013

CLINICAL DATA: Hypoxia, cough fluid

EXAM:
PORTABLE CHEST - 1 VIEW

[AP]
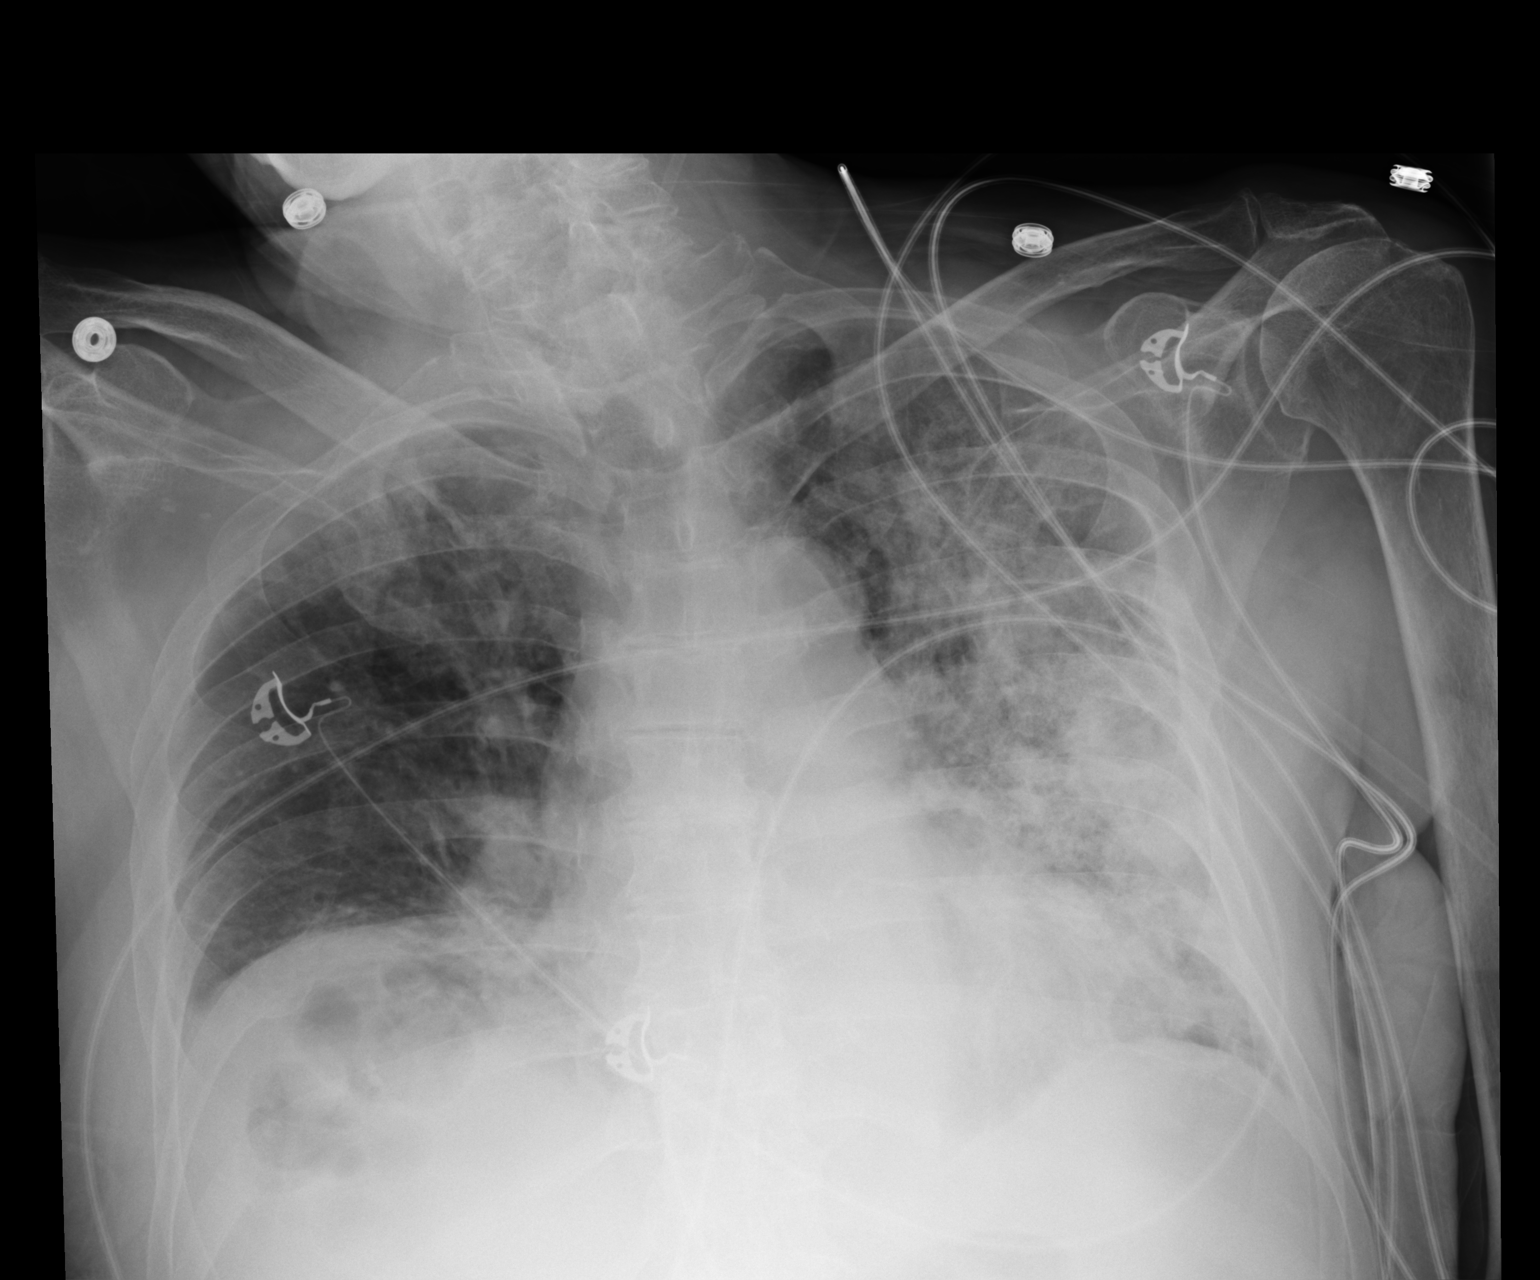

[1 of 1 positions shown; findings below may reference images not displayed]

FINDINGS: Interval progression of patchy airspace disease throughout the left
lung. Mild airspace disease in the right lung base are similar
compared to prior. Inspiratory volumes are lower. Cardiac and
mediastinal contours are unchanged.
IMPRESSION: Progressive airspace disease throughout the right lung concerning
for progression of multifocal pneumonia. The right basilar airspace
disease remains relatively stable.
